# Patient Record
Sex: Male | Born: 1940 | Race: White | Hispanic: No | Marital: Married | Smoking: Former smoker
Health system: Southern US, Community
[De-identification: ages and names within clinical notes are randomized; demographics above are authoritative.]

## PROBLEM LIST (undated history)

## (undated) DIAGNOSIS — M549 Dorsalgia, unspecified: Secondary | ICD-10-CM

## (undated) DIAGNOSIS — M1990 Unspecified osteoarthritis, unspecified site: Secondary | ICD-10-CM

## (undated) DIAGNOSIS — Z9889 Other specified postprocedural states: Secondary | ICD-10-CM

## (undated) DIAGNOSIS — C9110 Chronic lymphocytic leukemia of B-cell type not having achieved remission: Secondary | ICD-10-CM

## (undated) DIAGNOSIS — I2510 Atherosclerotic heart disease of native coronary artery without angina pectoris: Secondary | ICD-10-CM

## (undated) DIAGNOSIS — E785 Hyperlipidemia, unspecified: Secondary | ICD-10-CM

## (undated) DIAGNOSIS — G8929 Other chronic pain: Secondary | ICD-10-CM

## (undated) DIAGNOSIS — R112 Nausea with vomiting, unspecified: Secondary | ICD-10-CM

## (undated) DIAGNOSIS — E119 Type 2 diabetes mellitus without complications: Secondary | ICD-10-CM

## (undated) DIAGNOSIS — D171 Benign lipomatous neoplasm of skin and subcutaneous tissue of trunk: Principal | ICD-10-CM

## (undated) DIAGNOSIS — K219 Gastro-esophageal reflux disease without esophagitis: Secondary | ICD-10-CM

## (undated) DIAGNOSIS — Z720 Tobacco use: Secondary | ICD-10-CM

## (undated) DIAGNOSIS — G4733 Obstructive sleep apnea (adult) (pediatric): Secondary | ICD-10-CM

## (undated) DIAGNOSIS — R001 Bradycardia, unspecified: Secondary | ICD-10-CM

## (undated) DIAGNOSIS — I739 Peripheral vascular disease, unspecified: Secondary | ICD-10-CM

## (undated) DIAGNOSIS — I10 Essential (primary) hypertension: Secondary | ICD-10-CM

## (undated) DIAGNOSIS — R351 Nocturia: Secondary | ICD-10-CM

## (undated) HISTORY — DX: Benign lipomatous neoplasm of skin and subcutaneous tissue of trunk: D17.1

## (undated) HISTORY — PX: CARDIAC CATHETERIZATION: SHX172

## (undated) HISTORY — DX: Essential (primary) hypertension: I10

## (undated) HISTORY — DX: Atherosclerotic heart disease of native coronary artery without angina pectoris: I25.10

## (undated) HISTORY — DX: Peripheral vascular disease, unspecified: I73.9

## (undated) HISTORY — DX: Obstructive sleep apnea (adult) (pediatric): G47.33

## (undated) HISTORY — DX: Bradycardia, unspecified: R00.1

## (undated) HISTORY — DX: Tobacco use: Z72.0

## (undated) HISTORY — DX: Hyperlipidemia, unspecified: E78.5

---

## 1992-05-17 DIAGNOSIS — R112 Nausea with vomiting, unspecified: Secondary | ICD-10-CM

## 1992-05-17 DIAGNOSIS — Z9889 Other specified postprocedural states: Secondary | ICD-10-CM

## 1992-05-17 HISTORY — DX: Nausea with vomiting, unspecified: R11.2

## 1992-05-17 HISTORY — DX: Other specified postprocedural states: Z98.890

## 1993-02-14 HISTORY — PX: CORONARY ARTERY BYPASS GRAFT: SHX141

## 1993-03-10 HISTORY — PX: CARDIAC CATHETERIZATION: SHX172

## 1998-04-15 ENCOUNTER — Ambulatory Visit (HOSPITAL_COMMUNITY): Admission: RE | Admit: 1998-04-15 | Discharge: 1998-04-15 | Payer: Self-pay | Admitting: Gastroenterology

## 1998-05-17 HISTORY — PX: CYSTECTOMY: SUR359

## 1999-03-12 ENCOUNTER — Emergency Department (HOSPITAL_COMMUNITY): Admission: EM | Admit: 1999-03-12 | Discharge: 1999-03-12 | Payer: Self-pay | Admitting: Emergency Medicine

## 2000-10-07 ENCOUNTER — Encounter: Payer: Self-pay | Admitting: Chiropractic Medicine

## 2000-10-07 ENCOUNTER — Encounter: Admission: RE | Admit: 2000-10-07 | Discharge: 2000-10-07 | Payer: Self-pay | Admitting: Chiropractic Medicine

## 2000-10-08 ENCOUNTER — Encounter: Admission: RE | Admit: 2000-10-08 | Discharge: 2000-10-08 | Payer: Self-pay | Admitting: Chiropractic Medicine

## 2000-10-08 ENCOUNTER — Encounter: Payer: Self-pay | Admitting: Chiropractic Medicine

## 2001-06-13 ENCOUNTER — Ambulatory Visit (HOSPITAL_COMMUNITY): Admission: RE | Admit: 2001-06-13 | Discharge: 2001-06-13 | Payer: Self-pay | Admitting: Gastroenterology

## 2003-03-07 ENCOUNTER — Encounter (INDEPENDENT_AMBULATORY_CARE_PROVIDER_SITE_OTHER): Payer: Self-pay | Admitting: Specialist

## 2003-03-07 ENCOUNTER — Ambulatory Visit (HOSPITAL_COMMUNITY): Admission: RE | Admit: 2003-03-07 | Discharge: 2003-03-07 | Payer: Self-pay | Admitting: General Surgery

## 2003-08-21 ENCOUNTER — Encounter: Admission: RE | Admit: 2003-08-21 | Discharge: 2003-08-21 | Payer: Self-pay | Admitting: Internal Medicine

## 2005-03-03 ENCOUNTER — Encounter: Admission: RE | Admit: 2005-03-03 | Discharge: 2005-03-03 | Payer: Self-pay | Admitting: Internal Medicine

## 2007-11-27 ENCOUNTER — Encounter: Admission: RE | Admit: 2007-11-27 | Discharge: 2007-11-27 | Payer: Self-pay | Admitting: Internal Medicine

## 2007-12-31 DIAGNOSIS — D171 Benign lipomatous neoplasm of skin and subcutaneous tissue of trunk: Secondary | ICD-10-CM

## 2007-12-31 HISTORY — DX: Benign lipomatous neoplasm of skin and subcutaneous tissue of trunk: D17.1

## 2008-06-26 HISTORY — PX: CARDIAC CATHETERIZATION: SHX172

## 2008-10-11 ENCOUNTER — Encounter: Admission: RE | Admit: 2008-10-11 | Discharge: 2008-10-11 | Payer: Self-pay | Admitting: Internal Medicine

## 2009-02-13 ENCOUNTER — Ambulatory Visit: Payer: Self-pay | Admitting: Urology

## 2010-06-07 ENCOUNTER — Encounter (HOSPITAL_BASED_OUTPATIENT_CLINIC_OR_DEPARTMENT_OTHER): Payer: Self-pay | Admitting: Internal Medicine

## 2010-07-24 HISTORY — PX: CARDIOVASCULAR STRESS TEST: SHX262

## 2011-03-04 ENCOUNTER — Other Ambulatory Visit: Payer: Self-pay | Admitting: Internal Medicine

## 2011-03-04 DIAGNOSIS — M542 Cervicalgia: Secondary | ICD-10-CM

## 2011-03-06 ENCOUNTER — Ambulatory Visit
Admission: RE | Admit: 2011-03-06 | Discharge: 2011-03-06 | Disposition: A | Discharge status: Home / Self Care | Payer: Self-pay | Source: Ambulatory Visit | Attending: Internal Medicine | Admitting: Internal Medicine

## 2011-03-06 DIAGNOSIS — M542 Cervicalgia: Secondary | ICD-10-CM

## 2011-03-19 ENCOUNTER — Encounter (INDEPENDENT_AMBULATORY_CARE_PROVIDER_SITE_OTHER): Payer: PRIVATE HEALTH INSURANCE | Admitting: Surgery

## 2011-03-31 ENCOUNTER — Encounter (INDEPENDENT_AMBULATORY_CARE_PROVIDER_SITE_OTHER): Payer: Self-pay | Admitting: General Surgery

## 2011-04-02 ENCOUNTER — Ambulatory Visit (INDEPENDENT_AMBULATORY_CARE_PROVIDER_SITE_OTHER): Payer: Medicare Other | Admitting: Surgery

## 2011-04-02 ENCOUNTER — Encounter (INDEPENDENT_AMBULATORY_CARE_PROVIDER_SITE_OTHER): Payer: Self-pay | Admitting: Surgery

## 2011-04-02 VITALS — BP 118/70 | HR 80 | Temp 97.1°F | Resp 18 | Ht 70.0 in | Wt 216.5 lb

## 2011-04-02 DIAGNOSIS — D1739 Benign lipomatous neoplasm of skin and subcutaneous tissue of other sites: Secondary | ICD-10-CM

## 2011-04-02 DIAGNOSIS — D171 Benign lipomatous neoplasm of skin and subcutaneous tissue of trunk: Secondary | ICD-10-CM

## 2011-04-02 NOTE — Patient Instructions (Signed)
We will schedule surgery to remove the small lump on your right chest wall. This will be as an outpatient and you can resume normal activities after the surgery

## 2011-04-02 NOTE — Progress Notes (Signed)
  CC: Mass of back HPI: This patient was referred by his primary physician for a small mass on the right lateral chest wall bacteria. It is almost totally asymptomatic but occasionally bother him a little bit. He saw my now retired partner, Cyd Silence, in August of 2009 for this. At that time they elected to nothing. It was thought to be a lipoma   ROS: Our 12 point ROS was filled out by the patient and his negative.  MEDS: Current Outpatient Prescriptions on File Prior to Visit  Medication Sig Dispense Refill  . simvastatin (ZOCOR) 40 MG tablet Take 40 mg by mouth at bedtime.           ALLERGIES: No Known Allergies     PE General: The patient is alert oriented and generally healthy appearing. Chest: There is a 1.5 cm discrete mass in about the right mid axillary line. It feels attached to the scan and is a little firmer than the usual lipoma. There is not a punctum.  Data Reviewed Reviewed on our old chart and  from his primary care physician  Assessment Subcutaneous mass right lateral chest lipoma versus cyst  Plan However the alternatives which are discontinued followup since that is the same size as when seen by Dr.Ballen. Alternatively this could be removed under local anesthesia. Like to have it removed I will try to arrange that for him.

## 2012-03-23 HISTORY — PX: OTHER SURGICAL HISTORY: SHX169

## 2012-05-04 ENCOUNTER — Other Ambulatory Visit (HOSPITAL_COMMUNITY): Payer: Self-pay | Admitting: Cardiovascular Disease

## 2012-05-04 DIAGNOSIS — I2510 Atherosclerotic heart disease of native coronary artery without angina pectoris: Secondary | ICD-10-CM

## 2012-07-26 ENCOUNTER — Encounter (HOSPITAL_COMMUNITY): Payer: Self-pay

## 2012-10-17 ENCOUNTER — Ambulatory Visit (INDEPENDENT_AMBULATORY_CARE_PROVIDER_SITE_OTHER): Payer: Medicare Other | Admitting: Cardiology

## 2012-10-17 ENCOUNTER — Encounter: Payer: Self-pay | Admitting: Cardiology

## 2012-10-17 VITALS — BP 126/64 | HR 59 | Ht 70.0 in | Wt 233.0 lb

## 2012-10-17 DIAGNOSIS — I10 Essential (primary) hypertension: Secondary | ICD-10-CM | POA: Insufficient documentation

## 2012-10-17 DIAGNOSIS — I739 Peripheral vascular disease, unspecified: Secondary | ICD-10-CM

## 2012-10-17 DIAGNOSIS — E785 Hyperlipidemia, unspecified: Secondary | ICD-10-CM | POA: Insufficient documentation

## 2012-10-17 DIAGNOSIS — I2510 Atherosclerotic heart disease of native coronary artery without angina pectoris: Secondary | ICD-10-CM

## 2012-10-17 DIAGNOSIS — Z720 Tobacco use: Secondary | ICD-10-CM

## 2012-10-17 DIAGNOSIS — E119 Type 2 diabetes mellitus without complications: Secondary | ICD-10-CM | POA: Insufficient documentation

## 2012-10-17 DIAGNOSIS — F17200 Nicotine dependence, unspecified, uncomplicated: Secondary | ICD-10-CM

## 2012-10-17 HISTORY — DX: Tobacco use: Z72.0

## 2012-10-17 NOTE — Assessment & Plan Note (Signed)
Controlled on today's visit

## 2012-10-17 NOTE — Assessment & Plan Note (Signed)
Last cath 2010, grafts are 71 years old.  As per Dr. Hazle Coca recommendations we would like to do a stress test but patient does not wish to undergo this test unless he has a problem.  Reminded at the age of the graft and that we would be glad to see him at any point to do the nuclear stress test. Additionally instructed to call if he does develop shortness of breath with exertion or chest pain.  He does have nitroglycerin to take he needs them.

## 2012-10-17 NOTE — Assessment & Plan Note (Signed)
He states he does not use very often, and does help with the stress of caring for his wife who has multiple sclerosis. Again discussed importance of not using tobacco

## 2012-10-17 NOTE — Assessment & Plan Note (Signed)
Followed by Dr. Jarold Motto

## 2012-10-17 NOTE — Progress Notes (Signed)
10/17/2012   PCP: Garlan Fillers, MD   Chief Complaint  Patient presents with  . rov 6 months    Le edema    Primary Cardiologist:Dr. Erlene Quan   HPI: 73 year old, mild to moderately overweight, married Caucasian male, father of 2, grandfather to 4 grandchildren whose wife unfortunately is wheelchair bound with multiple sclerosis which has been progressively worsening. The patient is the primary caregiver and therefore is under a lot of stress at home. He has a history of CAD status post coronary artery bypass grafting in 1994. His other problems include hypertension and hyperlipidemia. He denied chest pain or shortness of breath. Last cardiac cath was June 26, 2008, revealing a patent LIMA to his LAD, a patent vein to a diagonal branch and a patent sequential vein to OM1 and 2 with an 80% stenosis just beyond the distal insertion and a 70% mid nondominant RCA stenosis with normal LV function. 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 His last Myoview performed July 24, 2010, was nonischemic. Recent lab work performed by Dr. Eloise Harman revealed total cholesterol 154, LDL 93 and HDL 30, similar to his prior readings.    Today he presents for routine cardiology followup. He denies any significant chest pain has not needed nitroglycerin. He occasionally has mild lower extremity edema that is stable.  He has no complaints.  Allergies  Allergen Reactions  . Lipitor (Atorvastatin)   . Codeine Rash    Current Outpatient Prescriptions  Medication Sig Dispense Refill  . aspirin 325 MG tablet Take 325 mg by mouth daily.      . cetirizine (ZYRTEC) 10 MG tablet Take 10 mg by mouth daily.      . Cinnamon 500 MG capsule Take 500 mg by mouth. 4 capsules daily      . Coenzyme Q10 (COQ-10) 400 MG CAPS Take 1 capsule by mouth  daily.      Marland Kitchen DIOVAN 160 MG tablet Take 160 mg by mouth daily.      Marland Kitchen doxazosin (CARDURA) 2 MG tablet Take 2 mg by mouth at bedtime.       . fluticasone (FLONASE) 50 MCG/ACT nasal spray as needed.      Marland Kitchen HYDROcodone-acetaminophen (VICODIN) 5-500 MG per tablet       . metFORMIN (GLUCOPHAGE-XR) 500 MG 24 hr tablet Take 500 mg by mouth 2 (two) times daily.      . metoprolol succinate (TOPROL-XL) 25 MG 24 hr tablet Take 25 mg by mouth daily.      . Multiple Vitamin (MULTIVITAMIN) capsule Take 1 capsule by mouth daily.      Marland Kitchen NITROSTAT 0.4 MG SL tablet every 5 (five) minutes as needed.       . simvastatin (ZOCOR) 40 MG tablet Take 40 mg by mouth at bedtime.         No current facility-administered medications for this visit.    Past Medical History  Diagnosis Date  . Diabetes mellitus   . Hyperlipidemia   . 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 CAD (coronary artery disease)     Hx of CABG 1994, last cath 2010 with patent grafts but graft insertion stenosis, treated medically  . PAD (peripheral artery disease)     75% lt renal artery stenosis, followed by dopplersand mild carotid stenosis, mild bilaateral  .  HTN (hypertension)   . Tobacco chew use 10/17/2012    Past Surgical History  Procedure Laterality Date  . Coronary artery bypass graft  1994  . Cystectomy  2000    left rib cage area   . Cardiac catheterization  1995, 2010    LIMA PATENT,  VG-diag; seq VG-OM1 & 2patent though 80% stenosis just beyond the insertion site   LAST NUC 06/2010 NEGATIVE FOR ISCHEMIA    ZOX:WRUEAVW:UJ colds or fevers, no weight changes Skin:no rashes or ulcers HEENT:no blurred vision, no congestion CV:see HPI PUL:see HPI GI:no diarrhea constipation or melena, no indigestion GU:no hematuria, no dysuria MS:no joint pain, no claudication Neuro:no syncope, no lightheadedness Endo:no diabetes, no thyroid disease  PHYSICAL EXAM BP 126/64  Pulse 59  Ht 5\' 10"  (1.778 m)  Wt 233 lb (105.688 kg)  BMI 33.43  kg/m2 General:Pleasant affect, NAD Skin:Warm and dry, brisk capillary refill HEENT:normocephalic, sclera clear, mucus membranes moist Neck:supple, no JVD, no bruits  Heart:S1S2 RRR without murmur, gallup, rub or click Lungs:clear without rales, rhonchi, or wheezes WJX:BJYN, non tender, + BS, do not palpate liver spleen or masses Ext:no lower ext edema, 2+ pedal pulses, 2+ radial pulses Neuro:alert and oriented, MAE, follows commands, + facial symmetry  WGN:FAOZHY at 59, 1st degree AV Block with PR 230 ms.Similar to last tracing.  ASSESSMENT AND PLAN CAD (coronary artery disease) Last cath 2010, grafts are 71 years old.  As per Dr. Hazle Coca recommendations we would like to do a stress test but patient does not wish to undergo this test unless he has a problem.  Reminded at the age of the graft and that we would be glad to see him at any point to do the nuclear stress test. Additionally instructed to call if he does develop shortness of breath with exertion or chest pain.  He does have nitroglycerin to take he needs them.  HTN (hypertension) Controlled on today's visit  Hyperlipidemia Dr. Jarold Motto follows his cholesterol and in December his LDL was 93 HDL 30.  Diabetes mellitus Followed by Dr. Jarold Motto  Tobacco chew use He states he does not use very often, and does help with the stress of caring for his wife who has multiple sclerosis. Again discussed importance of not using tobacco

## 2012-10-17 NOTE — Assessment & Plan Note (Signed)
Dr. Jarold Motto follows his cholesterol and in December his LDL was 93 HDL 30.

## 2012-10-17 NOTE — Patient Instructions (Addendum)
Call if any chest pain, shortness of breath or increased swelling. Dr. Allyson Sabal would like you to have stress test-your grafts are 71 years old, so call when you are ready to have. Heart Healthy diabetic diet. Otherwise follow up with Dr berry in 6 months.

## 2012-10-18 ENCOUNTER — Encounter: Payer: Self-pay | Admitting: Cardiology

## 2012-11-23 ENCOUNTER — Other Ambulatory Visit (HOSPITAL_COMMUNITY): Payer: Self-pay | Admitting: Cardiovascular Disease

## 2012-11-23 NOTE — Telephone Encounter (Signed)
Rx was sent to pharmacy electronically. 

## 2012-12-17 ENCOUNTER — Other Ambulatory Visit (HOSPITAL_COMMUNITY): Payer: Self-pay | Admitting: Cardiovascular Disease

## 2012-12-18 NOTE — Telephone Encounter (Signed)
Rx was sent to pharmacy electronically. 

## 2013-03-29 ENCOUNTER — Telehealth: Payer: Self-pay | Admitting: Oncology

## 2013-03-29 NOTE — Telephone Encounter (Signed)
S/W PT AND GVE NP APPT 11/26 @ 10:30 W/DR. SHADAD REFERRING DR. DAN PATTERSON DX- LEUKOCYTOSIS WELCOME PACKET MAILED

## 2013-03-29 NOTE — Telephone Encounter (Signed)
C/D 03/29/13 for appt. 04/11/13

## 2013-04-04 ENCOUNTER — Encounter: Payer: Self-pay | Admitting: Cardiovascular Disease

## 2013-04-04 ENCOUNTER — Ambulatory Visit (INDEPENDENT_AMBULATORY_CARE_PROVIDER_SITE_OTHER): Payer: Medicare Other | Admitting: Cardiovascular Disease

## 2013-04-04 VITALS — BP 156/92 | HR 63 | Ht 69.5 in | Wt 228.0 lb

## 2013-04-04 DIAGNOSIS — I10 Essential (primary) hypertension: Secondary | ICD-10-CM

## 2013-04-04 DIAGNOSIS — E785 Hyperlipidemia, unspecified: Secondary | ICD-10-CM

## 2013-04-04 DIAGNOSIS — Z79899 Other long term (current) drug therapy: Secondary | ICD-10-CM

## 2013-04-04 DIAGNOSIS — I2510 Atherosclerotic heart disease of native coronary artery without angina pectoris: Secondary | ICD-10-CM

## 2013-04-04 DIAGNOSIS — I739 Peripheral vascular disease, unspecified: Secondary | ICD-10-CM

## 2013-04-04 NOTE — Assessment & Plan Note (Signed)
On statin therapy with recent lipid profile performed by his PCP revealing a total cholesterol 193, LDL of 103 and HDL of 32. He does admit to dietary indiscretion.

## 2013-04-04 NOTE — Assessment & Plan Note (Signed)
And he was seen in the office 6 months ago he has remained completely stable. His left cardiac catheterization performed by myself 06/26/08 revealed patent grafts including a LIMA to his LAD, vein to the diagonal branch and a sequential vein to OM 1 and 2. He did have a total LAD and circumflex coronary artery and normal LV function with 75% left renal artery stenosis which was Ethibond a duplex ultrasound. A stress Myoview performed assessment/was nonischemic. I'm going to obtain an exercise Myoview stress test on him after the first year which will be from his prior study in a diabetic status post bypass grafting.

## 2013-04-04 NOTE — Assessment & Plan Note (Signed)
Borderline control today on appropriate medications

## 2013-04-04 NOTE — Patient Instructions (Signed)
  Your physician wants you to follow-up with him in : 1 year with Dr Allyson Sabal                                            and with an extender in : 6 months with Nada Boozer NP                    You will receive a reminder letter in the mail one month in advance. If you don't receive a letter, please call our office to schedule the follow-up appointment.   Your physician recommends that you return for lab work at your convenience, fasting    Your physician has ordered the following tests: exercise myoview and carotid dopplers

## 2013-04-04 NOTE — Progress Notes (Signed)
04/04/2013 Austin Herman   Sep 19, 1940  [id number]  Primary Physician Austin Fillers, MD Primary Cardiologist: Austin Gess MD Austin Herman   HPI:  The patient is a very pleasant 72 year old, mild to moderately overweight, married Caucasian male, father of 2, grandfather to 4 grandchildren whose wife unfortunately is wheelchair bound with multiple sclerosis which has been progressively worsening. The patient is the primary caregiver and therefore is under a lot of stress at home. He has a history of CAD status post coronary artery bypass grafting in 1994. His other problems include hypertension and hyperlipidemia. He denied chest pain or shortness of breath. I cath'd him June 26, 2008, revealing a patent LIMA to his LAD, a patent vein to a diagonal branch and a patent sequential vein to OM1 and 2 with an 80% stenosis just beyond the distal insertion and a 70% mid nondominant RCA stenosis with normal LV function. 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 His last Myoview performed July 24, 2010, was nonischemic. Recent lab work performed by Dr. Eloise Herman revealed total cholesterol 193, LDL of 103 and HDL of 32., moderately increased compared to his prior readings. Note no change in his symptoms and denies chest pain or shortness of breath.     Current Outpatient Prescriptions  Medication Sig Dispense Refill  . aspirin 325 MG tablet Take 325 mg by mouth daily.      . cetirizine (ZYRTEC) 10 MG tablet Take 10 mg by mouth daily.      Marland Kitchen DIOVAN 160 MG tablet TAKE 1 TABLET BY MOUTH EVERY DAY  30 tablet  11  . doxazosin (CARDURA) 2 MG tablet Take 2 mg by mouth at bedtime.       Marland Kitchen FARXIGA 10 MG TABS Take 10 mg by mouth daily.      . fluticasone (FLONASE) 50 MCG/ACT nasal spray Place 1 spray into the nose as needed.         Marland Kitchen HYDROcodone-acetaminophen (NORCO/VICODIN) 5-325 MG per tablet Take 1 tablet by mouth as needed for moderate pain.      . metFORMIN (GLUCOPHAGE-XR) 500 MG 24 hr tablet Take 500 mg by mouth 2 (two) times daily.      . metoprolol succinate (TOPROL-XL) 25 MG 24 hr tablet TAKE 1 TABLET BY MOUTH EVERY DAY  30 tablet  10  . Multiple Vitamin (MULTIVITAMIN) capsule Take 1 capsule by mouth daily.      Marland Kitchen NITROSTAT 0.4 MG SL tablet every 5 (five) minutes as needed.       . Omega-3 Fatty Acids (FISH OIL) 1200 MG CAPS Take 1,200 mg by mouth daily.      . simvastatin (ZOCOR) 40 MG tablet Take 40 mg by mouth at bedtime.         No current facility-administered medications for this visit.    Allergies  Allergen Reactions  . Lipitor [Atorvastatin]   . Codeine Rash    History   Social History  . Marital Status: Married    Spouse Name: N/A    Number of Children: N/A  . Years of Education: N/A   Occupational History  . Not on file.   Social History Main Topics  . Smoking status: Former Smoker    Types: Cigars  . Smokeless tobacco: Current User    Types: Chew  Comment: not ready to quit, does not use very often.  . Alcohol Use: Yes  . Drug Use: No  . Sexual Activity: Not on file   Other Topics Concern  . Not on file   Social History Narrative  . No narrative on file     Review of Systems: General: negative for chills, fever, night sweats or weight changes.  Cardiovascular: negative for chest pain, dyspnea on exertion, edema, orthopnea, palpitations, paroxysmal nocturnal dyspnea or shortness of breath Dermatological: negative for rash Respiratory: negative for cough or wheezing Urologic: negative for hematuria Abdominal: negative for nausea, vomiting, diarrhea, bright red blood per rectum, melena, or hematemesis Neurologic: negative for visual changes, syncope, or dizziness All other systems reviewed and are otherwise negative except as noted above.    Blood pressure 156/92,  pulse 63, height 5' 9.5" (1.765 m), weight 228 lb (103.42 kg).  General appearance: alert and no distress Neck: no adenopathy, no carotid bruit, no JVD, supple, symmetrical, trachea midline and thyroid not enlarged, symmetric, no tenderness/mass/nodules Lungs: clear to auscultation bilaterally Heart: regular rate and rhythm, S1, S2 normal, no murmur, click, rub or gallop Extremities: extremities normal, atraumatic, no cyanosis or edema  EKG sinus rhythm at 63 without ST or T wave changes  ASSESSMENT AND PLAN:   CAD (coronary artery disease) And he was seen in the office 6 months ago he has remained completely stable. His left cardiac catheterization performed by myself 06/26/08 revealed patent grafts including a LIMA to his LAD, vein to the diagonal branch and a sequential vein to OM 1 and 2. He did have a total LAD and circumflex coronary artery and normal LV function with 75% left renal artery stenosis which was Ethibond a duplex ultrasound. A stress Myoview performed assessment/was nonischemic. I'm going to obtain an exercise Myoview stress test on him after the first year which will be from his prior study in a diabetic status post bypass grafting.  PAD (peripheral artery disease) Patient has moderate renal artery stenosis as well as mild to moderate carotid artery disease by duplex ultrasound. His last carotid Doppler performed 03/23/12 revealed moderate bilateral ICA stenosis. He was neurologically asymptomatic. We will repeat this  HTN (hypertension) Borderline control today on appropriate medications  Hyperlipidemia On statin therapy with recent lipid profile performed by his PCP revealing a total cholesterol 193, LDL of 103 and HDL of 32. He does admit to dietary indiscretion.      Austin Gess MD FACP,FACC,FAHA, [facility] 04/04/2013 10:07 AM

## 2013-04-04 NOTE — Assessment & Plan Note (Signed)
Patient has moderate renal artery stenosis as well as mild to moderate carotid artery disease by duplex ultrasound. His last carotid Doppler performed 03/23/12 revealed moderate bilateral ICA stenosis. He was neurologically asymptomatic. We will repeat this

## 2013-04-06 ENCOUNTER — Other Ambulatory Visit: Payer: Self-pay | Admitting: Oncology

## 2013-04-06 DIAGNOSIS — D72829 Elevated white blood cell count, unspecified: Secondary | ICD-10-CM

## 2013-04-09 ENCOUNTER — Encounter: Payer: Self-pay | Admitting: Cardiovascular Disease

## 2013-04-11 ENCOUNTER — Ambulatory Visit (HOSPITAL_BASED_OUTPATIENT_CLINIC_OR_DEPARTMENT_OTHER): Payer: 59 | Admitting: Oncology

## 2013-04-11 ENCOUNTER — Ambulatory Visit (HOSPITAL_BASED_OUTPATIENT_CLINIC_OR_DEPARTMENT_OTHER): Payer: Medicare Other

## 2013-04-11 ENCOUNTER — Encounter: Payer: Self-pay | Admitting: Oncology

## 2013-04-11 ENCOUNTER — Other Ambulatory Visit (HOSPITAL_COMMUNITY)
Admission: RE | Admit: 2013-04-11 | Discharge: 2013-04-11 | Disposition: A | Discharge status: Home / Self Care | Payer: 59 | Source: Ambulatory Visit | Attending: Oncology | Admitting: Oncology

## 2013-04-11 ENCOUNTER — Telehealth: Payer: Self-pay | Admitting: Oncology

## 2013-04-11 ENCOUNTER — Other Ambulatory Visit (HOSPITAL_BASED_OUTPATIENT_CLINIC_OR_DEPARTMENT_OTHER): Payer: 59 | Admitting: Lab

## 2013-04-11 VITALS — BP 114/50 | HR 47 | Temp 97.6°F | Resp 18 | Ht 69.5 in | Wt 227.2 lb

## 2013-04-11 DIAGNOSIS — D72820 Lymphocytosis (symptomatic): Secondary | ICD-10-CM

## 2013-04-11 DIAGNOSIS — D72829 Elevated white blood cell count, unspecified: Secondary | ICD-10-CM

## 2013-04-11 LAB — COMPREHENSIVE METABOLIC PANEL (CC13)
AST: 23 U/L (ref 5–34)
Alkaline Phosphatase: 87 U/L (ref 40–150)
Chloride: 106 mEq/L (ref 98–109)
Glucose: 181 mg/dl — ABNORMAL HIGH (ref 70–140)
Total Bilirubin: 0.61 mg/dL (ref 0.20–1.20)
Total Protein: 7.5 g/dL (ref 6.4–8.3)

## 2013-04-11 LAB — CBC WITH DIFFERENTIAL/PLATELET
BASO%: 0.6 % (ref 0.0–2.0)
EOS%: 1.7 % (ref 0.0–7.0)
HCT: 45.3 % (ref 38.4–49.9)
HGB: 14.6 g/dL (ref 13.0–17.1)
MCH: 28.9 pg (ref 27.2–33.4)
MCHC: 32.3 g/dL (ref 32.0–36.0)
MCV: 89.5 fL (ref 79.3–98.0)
MONO#: 0.7 10*3/uL (ref 0.1–0.9)
NEUT#: 3.6 10*3/uL (ref 1.5–6.5)
NEUT%: 21.8 % — ABNORMAL LOW (ref 39.0–75.0)
RBC: 5.06 10*6/uL (ref 4.20–5.82)
lymph#: 11.8 10*3/uL — ABNORMAL HIGH (ref 0.9–3.3)

## 2013-04-11 NOTE — Consult Note (Signed)
Reason for Referral: Lymphocytosis.   HPI: 72 year old gentleman native of [place] but have been living in this area the majority of his life. He has a past medical history significant for coronary artery disease well as recent diagnosis of diabetes. He has been in his usual state of health without really any symptomatology when he was noted to have a lymphocytosis on his routine CBC by his primary care physician. His white cell count was elevated on 15,000 with a increased lymphocyte percentage of around 58%. He had a normal white cell count and normal platelet count. This was done in November of 2014 and was compared to 2013 where he had mild lymphocytosis with a white cell count of 11,000 at slightly increased lymphocyte percentage. He has no previous counts available at this time. Patient reports he has not had any fevers or chills or sweats. Has not reported any weight loss or appetite changes. Has not reported any lymphadenopathy.  Clinically, he is completely asymptomatic. He did not report any headaches, blurred vision, double vision or changes in his mental status. He does not report any headaches or seizure activity. He does not report any chest pain shortness of breath or difficulty breathing. Does not report any cough or hemoptysis. Does not report any nausea or vomiting or abdominal pain. Does not report any constipation or diarrhea. Does not report any melena or hematochezia. Does not report any frequency urgency or hesitancy. Does not report any hematuria or dysuria. Is that report any bleeding or clotting tendencies. Is that report any recurrent sinopulmonary infections. Has not reported any recent hospitalization or illnesses. He denied any recent viral infections. He is continued to be very active and cares for his wife that is medically disabled due to multiple sclerosis.  Past Medical History  Diagnosis Date  . Diabetes mellitus   . Hyperlipidemia   . 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  CAD (coronary artery disease)     Hx of CABG 1994, last cath 2010 with patent grafts but graft insertion stenosis, treated medically  . PAD (peripheral artery disease)     75% lt renal artery stenosis, followed by dopplersand mild carotid stenosis, mild bilaateral  . HTN (hypertension)   . Tobacco chew use 10/17/2012  . OSA (obstructive sleep apnea)     Sleep Study - 03/30/2009 - AHI-40.6/hr, AHI during REM-36.9/hr, RDI-52.4, RDI during REM-36.9/hr, and average O2 sat during REM and NREM-90%.  :  Past Surgical History  Procedure Laterality Date  . Coronary artery bypass graft  1994  . Cystectomy  2000    left rib cage area   . Cardiac catheterization  1995, 2010    LIMA PATENT,  VG-diag; seq VG-OM1 & 2patent though 80% stenosis just beyond the insertion site   LAST NUC 06/2010 NEGATIVE FOR ISCHEMIA  . Carotid doppler  03/23/2012    R Subclavian 0-49% diameter reduction, Bilat Bulb/Proximal ICAs-no significant diameter reduction, Bilat mid ICAs 50-69% diameter reduction, L Subclavian 50-69% diameter reduction.  . Cardiac catheterization  03/10/1993    Recommendation - CABG  . Coronary artery bypass graft  02/1993    LIMA to LAD, vein graft to firs diag, sequential vein graft to OM1 and OM 2  . Cardiac catheterization  06/26/2008    No intervention. Continue medical therapy.  . Cardiovascular stress test  07/24/2010    No scintigraphic evidence for inducable myocardial ischemia.ECG positive for ischemia. Exaggerated blood pressure response to exercise.   :  Current Outpatient Prescriptions  Medication Sig  Dispense Refill  . aspirin 325 MG tablet Take 325 mg by mouth daily.      . cetirizine (ZYRTEC) 10 MG tablet Take 10 mg by mouth daily.      Marland Kitchen DIOVAN 160 MG tablet TAKE 1 TABLET BY MOUTH EVERY DAY  30 tablet  11  . doxazosin (CARDURA) 2 MG tablet Take 2 mg by mouth at bedtime.       Marland Kitchen FARXIGA 10 MG TABS Take 10 mg by mouth daily.      . fluticasone (FLONASE) 50 MCG/ACT nasal spray Place 1  spray into the nose as needed.       Marland Kitchen HYDROcodone-acetaminophen (NORCO/VICODIN) 5-325 MG per tablet Take 1 tablet by mouth as needed for moderate pain.      . metFORMIN (GLUCOPHAGE-XR) 500 MG 24 hr tablet Take 500 mg by mouth 2 (two) times daily.      . metoprolol succinate (TOPROL-XL) 25 MG 24 hr tablet TAKE 1 TABLET BY MOUTH EVERY DAY  30 tablet  10  . Multiple Vitamin (MULTIVITAMIN) capsule Take 1 capsule by mouth daily.      Marland Kitchen NITROSTAT 0.4 MG SL tablet every 5 (five) minutes as needed.       . Omega-3 Fatty Acids (FISH OIL) 1200 MG CAPS Take 1,200 mg by mouth daily.      . simvastatin (ZOCOR) 40 MG tablet Take 40 mg by mouth at bedtime.         No current facility-administered medications for this visit.       Allergies  Allergen Reactions  . Lipitor [Atorvastatin]   . Codeine Rash  :  Family History  Problem Relation Age of Onset  . Cancer Father   . Cancer Brother   :  History   Social History  . Marital Status: Married    Spouse Name: N/A    Number of Children: N/A  . Years of Education: N/A   Occupational History  . Not on file.   Social History Main Topics  . Smoking status: Former Smoker    Types: Cigars  . Smokeless tobacco: Current User    Types: Chew     Comment: not ready to quit, does not use very often.  . Alcohol Use: Yes  . Drug Use: No  . Sexual Activity: Not on file   Other Topics Concern  . Not on file   Social History Narrative  . No narrative on file  :  Pertinent items are noted in HPI.  Exam: Blood pressure 114/50, pulse 47, temperature 97.6 F (36.4 C), temperature source Oral, resp. rate 18, height 5' 9.5" (1.765 m), weight 227 lb 3.2 oz (103.057 kg). General appearance: alert, cooperative and appears stated age Head: Normocephalic, without obvious abnormality, atraumatic Nose: Nares normal. Septum midline. Mucosa normal. No drainage or sinus tenderness. Throat: lips, mucosa, and tongue normal; teeth and gums normal Neck: no  adenopathy, no carotid bruit, no JVD, supple, symmetrical, trachea midline and thyroid not enlarged, symmetric, no tenderness/mass/nodules Back: symmetric, no curvature. ROM normal. No CVA tenderness. Resp: clear to auscultation bilaterally Chest wall: no tenderness Cardio: regular rate and rhythm, S1, S2 normal, no murmur, click, rub or gallop GI: soft, non-tender; bowel sounds normal; no masses,  no organomegaly Extremities: extremities normal, atraumatic, no cyanosis or edema Pulses: 2+ and symmetric Skin: Skin color, texture, turgor normal. No rashes or lesions Lymph nodes: Cervical, supraclavicular, and axillary nodes normal.   Recent Labs  04/11/13 1020  WBC 16.5*  HGB  14.6  HCT 45.3  PLT 139*      Blood smear review:   I personally reviewed his peripheral senior and showed increased lymphocytes with smudge cells noted. His red cells and platelet counts and morphology appeared normal   Assessment and Plan:   72 year old gentleman with a lymphocytosis associated with mild leukocytosis. This was discovered incidentally on his CBC with his blood count today confirming that. His white cell count around 16.5 K with his lymphocyte percentage around 71%. The differential diagnosis discussed today with Mr. Angelina Ok which include reactive lymphocytosis due to recent illnesses or exposure which is I think is less likely. 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 Although other lymphoproliferative disorders could be consideration such as prolymphocytic leukemia, leukemic phase of mantle cell lymphoma, among other conditions. To work this up completely, I will send his blood for flow cytometry I will also obtain a CT scan of the chest abdomen and pelvis. Depending on these findings the management approach will dictate that.  Given the fact that CLL is the most likely condition and the patient inquired about it  previously, I had a lengthy discussion discussing the natural course of this disease as well as the different treatment options. If it's in deed that's what he has, he is completely asymptomatic and probably will not require any treatment at this time. I counseled him about complications associated with lymphoproliferative disorder and more specifically CLL. These would include immune dysregulation, opportunistic infections, constitutional symptoms, and pancytopenia.  The plan for the time being is to obtain imaging studies and followup on the results of his flow cytometry and his followup will be dictated by these results.

## 2013-04-11 NOTE — Telephone Encounter (Signed)
gv and printed appt sched and avs for pt for Feb...gv pt barium   °

## 2013-04-11 NOTE — Progress Notes (Signed)
Please see consult note.  

## 2013-04-11 NOTE — Progress Notes (Signed)
Checked in new pt with no financial concerns. °

## 2013-04-18 ENCOUNTER — Ambulatory Visit (HOSPITAL_COMMUNITY)
Admission: RE | Admit: 2013-04-18 | Discharge: 2013-04-18 | Disposition: A | Discharge status: Home / Self Care | Payer: Medicare Other | Source: Ambulatory Visit | Attending: Cardiovascular Disease | Admitting: Cardiovascular Disease

## 2013-04-18 DIAGNOSIS — N281 Cyst of kidney, acquired: Secondary | ICD-10-CM | POA: Insufficient documentation

## 2013-04-18 DIAGNOSIS — I6529 Occlusion and stenosis of unspecified carotid artery: Secondary | ICD-10-CM | POA: Insufficient documentation

## 2013-04-18 DIAGNOSIS — R911 Solitary pulmonary nodule: Secondary | ICD-10-CM | POA: Insufficient documentation

## 2013-04-18 DIAGNOSIS — I739 Peripheral vascular disease, unspecified: Secondary | ICD-10-CM

## 2013-04-18 DIAGNOSIS — I700 Atherosclerosis of aorta: Secondary | ICD-10-CM | POA: Insufficient documentation

## 2013-04-18 DIAGNOSIS — I2510 Atherosclerotic heart disease of native coronary artery without angina pectoris: Secondary | ICD-10-CM | POA: Insufficient documentation

## 2013-04-18 DIAGNOSIS — R5381 Other malaise: Secondary | ICD-10-CM | POA: Insufficient documentation

## 2013-04-18 DIAGNOSIS — D72829 Elevated white blood cell count, unspecified: Secondary | ICD-10-CM | POA: Insufficient documentation

## 2013-04-18 DIAGNOSIS — I7090 Unspecified atherosclerosis: Secondary | ICD-10-CM | POA: Insufficient documentation

## 2013-04-18 DIAGNOSIS — N269 Renal sclerosis, unspecified: Secondary | ICD-10-CM | POA: Insufficient documentation

## 2013-04-18 DIAGNOSIS — R161 Splenomegaly, not elsewhere classified: Secondary | ICD-10-CM | POA: Insufficient documentation

## 2013-04-18 DIAGNOSIS — Z951 Presence of aortocoronary bypass graft: Secondary | ICD-10-CM | POA: Insufficient documentation

## 2013-04-18 NOTE — Progress Notes (Signed)
Carotid Duplex Completed. °Brianna L Mazza,RVT °

## 2013-04-19 ENCOUNTER — Encounter (HOSPITAL_COMMUNITY): Payer: Self-pay

## 2013-04-19 ENCOUNTER — Ambulatory Visit (HOSPITAL_COMMUNITY)
Admission: RE | Admit: 2013-04-19 | Discharge: 2013-04-19 | Disposition: A | Discharge status: Home / Self Care | Payer: Medicare Other | Source: Ambulatory Visit | Attending: Oncology | Admitting: Oncology

## 2013-04-19 DIAGNOSIS — R161 Splenomegaly, not elsewhere classified: Secondary | ICD-10-CM | POA: Insufficient documentation

## 2013-04-19 DIAGNOSIS — R5381 Other malaise: Secondary | ICD-10-CM | POA: Insufficient documentation

## 2013-04-19 DIAGNOSIS — D72829 Elevated white blood cell count, unspecified: Secondary | ICD-10-CM | POA: Insufficient documentation

## 2013-04-19 DIAGNOSIS — I700 Atherosclerosis of aorta: Secondary | ICD-10-CM | POA: Insufficient documentation

## 2013-04-19 DIAGNOSIS — N269 Renal sclerosis, unspecified: Secondary | ICD-10-CM | POA: Insufficient documentation

## 2013-04-19 DIAGNOSIS — R911 Solitary pulmonary nodule: Secondary | ICD-10-CM | POA: Insufficient documentation

## 2013-04-19 DIAGNOSIS — I2510 Atherosclerotic heart disease of native coronary artery without angina pectoris: Secondary | ICD-10-CM | POA: Insufficient documentation

## 2013-04-19 DIAGNOSIS — Z951 Presence of aortocoronary bypass graft: Secondary | ICD-10-CM | POA: Insufficient documentation

## 2013-04-19 DIAGNOSIS — N289 Disorder of kidney and ureter, unspecified: Secondary | ICD-10-CM | POA: Insufficient documentation

## 2013-04-19 DIAGNOSIS — K7689 Other specified diseases of liver: Secondary | ICD-10-CM | POA: Insufficient documentation

## 2013-04-19 DIAGNOSIS — D72820 Lymphocytosis (symptomatic): Secondary | ICD-10-CM

## 2013-04-19 DIAGNOSIS — N281 Cyst of kidney, acquired: Secondary | ICD-10-CM | POA: Insufficient documentation

## 2013-04-19 MED ORDER — IOHEXOL 300 MG/ML  SOLN
100.0000 mL | Freq: Once | INTRAMUSCULAR | Status: AC | PRN
  Administered 2013-04-19: 100 mL via INTRAVENOUS

## 2013-04-20 ENCOUNTER — Telehealth: Payer: Self-pay | Admitting: *Deleted

## 2013-04-20 NOTE — Telephone Encounter (Signed)
Message copied by Reesa Chew on Fri Apr 20, 2013 11:20 AM ------      Message from: Benjiman Core      Created: Thu Apr 19, 2013 12:04 PM       Please call him and let him know that his scan looks good and just keep his MD follow up in 06/2013. ------

## 2013-04-20 NOTE — Telephone Encounter (Signed)
Spoke with patient, let him know his scan looked good and to keep regularly scheduled appt for feb 2015

## 2013-04-23 ENCOUNTER — Telehealth: Payer: Self-pay | Admitting: *Deleted

## 2013-04-23 ENCOUNTER — Encounter: Payer: Self-pay | Admitting: *Deleted

## 2013-04-23 DIAGNOSIS — I6529 Occlusion and stenosis of unspecified carotid artery: Secondary | ICD-10-CM

## 2013-04-23 NOTE — Telephone Encounter (Signed)
Message copied by Marella Bile on Mon Apr 23, 2013  1:11 PM ------      Message from: Runell Gess      Created: Sun Apr 22, 2013  7:04 PM       No change from prior study. Repeat in 12 months. ------

## 2013-04-23 NOTE — Telephone Encounter (Signed)
Order placed for repeat carotid dopplers in 1 year  

## 2013-04-24 ENCOUNTER — Encounter (HOSPITAL_COMMUNITY): Payer: Self-pay | Admitting: Pharmacy Technician

## 2013-04-24 ENCOUNTER — Ambulatory Visit (INDEPENDENT_AMBULATORY_CARE_PROVIDER_SITE_OTHER): Payer: Medicare Other | Admitting: Physician Assistant

## 2013-04-24 ENCOUNTER — Encounter: Payer: Self-pay | Admitting: Cardiology

## 2013-04-24 ENCOUNTER — Other Ambulatory Visit: Payer: Self-pay | Admitting: *Deleted

## 2013-04-24 ENCOUNTER — Telehealth: Payer: Self-pay | Admitting: Cardiovascular Disease

## 2013-04-24 VITALS — BP 140/70 | HR 71 | Ht 70.0 in | Wt 227.0 lb

## 2013-04-24 DIAGNOSIS — F17200 Nicotine dependence, unspecified, uncomplicated: Secondary | ICD-10-CM

## 2013-04-24 DIAGNOSIS — E119 Type 2 diabetes mellitus without complications: Secondary | ICD-10-CM

## 2013-04-24 DIAGNOSIS — I447 Left bundle-branch block, unspecified: Secondary | ICD-10-CM

## 2013-04-24 DIAGNOSIS — I2510 Atherosclerotic heart disease of native coronary artery without angina pectoris: Secondary | ICD-10-CM

## 2013-04-24 DIAGNOSIS — I739 Peripheral vascular disease, unspecified: Secondary | ICD-10-CM

## 2013-04-24 DIAGNOSIS — Z01812 Encounter for preprocedural laboratory examination: Secondary | ICD-10-CM

## 2013-04-24 DIAGNOSIS — I200 Unstable angina: Secondary | ICD-10-CM

## 2013-04-24 DIAGNOSIS — E785 Hyperlipidemia, unspecified: Secondary | ICD-10-CM

## 2013-04-24 DIAGNOSIS — Z720 Tobacco use: Secondary | ICD-10-CM

## 2013-04-24 DIAGNOSIS — I10 Essential (primary) hypertension: Secondary | ICD-10-CM

## 2013-04-24 LAB — COMPLETE METABOLIC PANEL WITH GFR
AST: 19 U/L (ref 0–37)
Alkaline Phosphatase: 85 U/L (ref 39–117)
CO2: 28 mEq/L (ref 19–32)
Chloride: 101 mEq/L (ref 96–112)
Creat: 1.15 mg/dL (ref 0.50–1.35)
GFR, Est African American: 74 mL/min
GFR, Est Non African American: 64 mL/min
Glucose, Bld: 142 mg/dL — ABNORMAL HIGH (ref 70–99)
Potassium: 5.2 mEq/L (ref 3.5–5.3)
Sodium: 141 mEq/L (ref 135–145)
Total Bilirubin: 0.8 mg/dL (ref 0.3–1.2)
Total Protein: 7.3 g/dL (ref 6.0–8.3)

## 2013-04-24 NOTE — Patient Instructions (Addendum)
1.  We will schedule you for a left heart cath for tomorrow.  2.  Do not take Diovan and Metformin.

## 2013-04-24 NOTE — Telephone Encounter (Signed)
Lmom.  He is not scheduled for an angioplasty with [place].

## 2013-04-24 NOTE — Telephone Encounter (Signed)
Please have Samara Deist call him. This is in regards to his angioplasty he is going to have tomorrow,

## 2013-04-24 NOTE — Assessment & Plan Note (Addendum)
New LBBB.  Pre procedure labs,  Left heart cath tomorrow with Dr. Herbie Baltimore.  Continue Imdur. I told the patient he can increase the imdur to 60mg  if he has more pain.

## 2013-04-24 NOTE — Telephone Encounter (Signed)
Sent to [place] to advise

## 2013-04-24 NOTE — Telephone Encounter (Signed)
I spoke with patient.  He saw Dr Jarold Motto yesterday for ongoing chest pain.  Dr Jarold Motto told him that he needed an angioplasty on Wednesday.  The angioplasty is not schedule and was not conveyed to [place].  The chest pain that Mr Parlato has been experiencing is new and requiring Ntg use.  I made patient an appt to be seen today in our office.

## 2013-04-24 NOTE — Assessment & Plan Note (Signed)
Continue Statin.  Recheck Lipids in AM.

## 2013-04-24 NOTE — Assessment & Plan Note (Signed)
New

## 2013-04-24 NOTE — Progress Notes (Signed)
Date:  04/24/2013   ID:  Austin Herman, DOB 01/10/1941, MRN [id number]  PCP:  Garlan Fillers, MD  Primary Cardiologist:  Allyson Sabal     History of Present Illness: Austin Herman is a 72 y.o. male mild to moderately overweight, married Caucasian male, father of 2, grandfather to 4 grandchildren whose wife unfortunately is wheelchair bound with multiple sclerosis which has been progressively worsening.  The patient is the primary caregiver and therefore is under a lot of stress at home. He has a history of CAD status post coronary artery bypass grafting in 1994. His other problems include hypertension and hyperlipidemia.  Dr. Allyson Sabal cath'd him June 26, 2008, revealing a patent LIMA to his LAD, a patent vein to a diagonal branch and a patent sequential vein to OM1 and 2 with an 80% stenosis just beyond the distal insertion and a 70% mid nondominant RCA stenosis with normal LV function. 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 His last Myoview performed July 24, 2010, was nonischemic. Recent lab work performed by Dr. Eloise Harman revealed total cholesterol 193, LDL of 103 and HDL of 32., moderately increased compared to his prior readings.   He last saw Dr. Allyson Sabal on April 04, 2013, at which time he was not experiencing any CP.  He presents today after being seen by Dr. Eloise Harman.  The patient reports CP which started on Thanksgiving. He describes the pain as a pressure also as burning. He said he tried but it was indigestion and he tried to Gelusil just about every time he had the pain. He reports some relief with it. He also reports that this past "Sunday the chest pain woke him up at night. It was 7/10 in intensity and radiated to his left arm and jaw. He tried a nitroglycerin which helped. He continues to have chest pain approximately 2 times  per day. Yesterday was seen [facility] office and was started on Imdur 30 mg, the first dose of which he took today.  He also maintains a little LEE.  The patient currently denies nausea, vomiting, fever,  shortness of breath, orthopnea, dizziness, PND, cough, congestion, abdominal pain, hematochezia, melena, claudication.  Wt Readings from Last 3 Encounters:  04/24/13 227 lb (102.967 kg)  04/11/13 227 lb 3.2 oz (103.057 kg)  04/04/13 228 lb (103.42 kg)     Past Medical History  Diagnosis Date  . Diabetes mellitus   . Hyperlipidemia   . 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 CAD (coronary artery disease)     Hx of CABG 1994, last cath 2010 with patent grafts but graft insertion stenosis, treated medically  . PAD (peripheral artery disease)     75" % lt renal artery stenosis, followed by dopplersand mild carotid stenosis, mild bilaateral  . HTN (hypertension)   . Tobacco chew use 10/17/2012  . OSA (obstructive sleep apnea)     Sleep Study - 03/30/2009 - AHI-40.6/hr, AHI during REM-36.9/hr, RDI-52.4, RDI during REM-36.9/hr, and average O2 sat during REM and NREM-90%.    Current Outpatient Prescriptions  Medication Sig Dispense Refill  . aspirin 325 MG tablet Take 325 mg by mouth daily.      . cetirizine (ZYRTEC) 10 MG tablet Take 10 mg by mouth daily.      Marland Kitchen DIOVAN 160 MG tablet TAKE 1 TABLET BY MOUTH EVERY DAY  30 tablet  11  . doxazosin (CARDURA) 2 MG tablet Take 2 mg by mouth at bedtime.       Marland Kitchen FARXIGA 10 MG TABS Take 10 mg by mouth daily.      . fluticasone (FLONASE) 50 MCG/ACT nasal spray Place 1 spray into the nose as needed.       Marland Kitchen HYDROcodone-acetaminophen (NORCO/VICODIN) 5-325 MG per tablet Take 1 tablet by mouth as needed for moderate pain.      . isosorbide mononitrate (IMDUR) 30 MG 24 hr tablet Take 30 mg by mouth daily.      . metFORMIN (GLUCOPHAGE-XR) 500 MG 24 hr tablet Take 500 mg by mouth 2 (two) times daily.      . metoprolol succinate (TOPROL-XL) 25 MG 24 hr tablet TAKE 1  TABLET BY MOUTH EVERY DAY  30 tablet  10  . Multiple Vitamin (MULTIVITAMIN) capsule Take 1 capsule by mouth daily.      Marland Kitchen NITROSTAT 0.4 MG SL tablet every 5 (five) minutes as needed.       . Omega-3 Fatty Acids (FISH OIL) 1200 MG CAPS Take 1,200 mg by mouth daily.      . simvastatin (ZOCOR) 40 MG tablet Take 40 mg by mouth at bedtime.         No current facility-administered medications for this visit.    Allergies:    Allergies  Allergen Reactions  . Lipitor [Atorvastatin]   . Codeine Rash    Social History:  The patient  reports that he has quit smoking. His smoking use included Cigars. His smokeless tobacco use includes Chew. He reports that he drinks alcohol. He reports that he does not use illicit drugs.   Family history:   Family History  Problem Relation Age of Onset  . Cancer Father   . Cancer Brother     ROS:  Please see the history of present illness.  All other systems reviewed and negative.   PHYSICAL EXAM: VS:  BP 140/70  Pulse 71  Ht 5\' 10"  (1.778 m)  Wt 227 lb (102.967 kg)  BMI 32.57 kg/m2 Obese, well developed, in no acute distress HEENT: Pupils are equal round react to light accommodation extraocular movements are intact.  Neck: no JVDNo cervical lymphadenopathy. Cardiac: Irrgular rate and rhythm without murmurs rubs or gallops. Lungs:  clear to auscultation bilaterally, no wheezing, rhonchi or rales Abd: soft, nontender, positive bowel sounds all quadrants, no hepatosplenomegaly Ext: no lower extremity edema.  2+ radial and dorsalis pedis pulses. Skin: warm and dry Neuro:  Grossly normal  EKG:   New LBBB.  PVCs, 1st degree AVB  ASSESSMENT AND PLAN:  Problem List Items Addressed This Visit   Diabetes mellitus (Chronic)   Relevant Orders      INR/PT      PTT      CBC with Differential      COMPLETE METABOLIC PANEL WITH GFR      Urinalysis, microscopic only      DG Chest 2 View   Tobacco chew use (Chronic)   Relevant Orders      INR/PT       PTT      CBC with Differential      COMPLETE METABOLIC PANEL WITH GFR      Urinalysis, microscopic only      DG Chest 2 View   Unstable angina - Primary     New LBBB.  Pre procedure labs,  Left heart cath tomorrow with Dr. Herbie Baltimore.  Continue  Imdur. I told the patient he can increase the imdur to 60mg  if he has more pain.    Relevant Medications      isosorbide mononitrate (IMDUR) 30 MG 24 hr tablet   Other Relevant Orders      EKG 12-Lead      LEFT HEART CATHETERIZATION WITH CORONARY ANGIOGRAM      INR/PT      PTT      CBC with Differential      COMPLETE METABOLIC PANEL WITH GFR      Urinalysis, microscopic only      DG Chest 2 View   LBBB (left bundle branch block)     New    CAD (coronary artery disease)   Relevant Orders      INR/PT      PTT      CBC with Differential      COMPLETE METABOLIC PANEL WITH GFR      Urinalysis, microscopic only      DG Chest 2 View   PAD (peripheral artery disease)   Relevant Orders      INR/PT      PTT      CBC with Differential      COMPLETE METABOLIC PANEL WITH GFR      Urinalysis, microscopic only      DG Chest 2 View   HTN (hypertension)   Relevant Orders      INR/PT      PTT      CBC with Differential      COMPLETE METABOLIC PANEL WITH GFR      Urinalysis, microscopic only      DG Chest 2 View   Hyperlipidemia     Continue Statin.  Recheck Lipids in AM.    Relevant Orders      INR/PT      PTT      CBC with Differential      COMPLETE METABOLIC PANEL WITH GFR      Urinalysis, microscopic only      DG Chest 2 View    Other Visit Diagnoses   Pre-procedure lab exam        Relevant Orders       INR/PT       PTT       CBC with Differential       COMPLETE METABOLIC PANEL WITH GFR       Urinalysis, microscopic only       DG Chest 2 View

## 2013-04-25 ENCOUNTER — Encounter (HOSPITAL_COMMUNITY): Payer: Self-pay | Admitting: General Practice

## 2013-04-25 ENCOUNTER — Inpatient Hospital Stay (HOSPITAL_COMMUNITY)
Admission: RE | Admit: 2013-04-25 | Discharge: 2013-04-27 | DRG: 249 | Disposition: A | Discharge status: Home / Self Care | Payer: Medicare Other | Source: Ambulatory Visit | Attending: Cardiovascular Disease | Admitting: Cardiovascular Disease

## 2013-04-25 ENCOUNTER — Encounter: Payer: Self-pay | Admitting: Physician Assistant

## 2013-04-25 ENCOUNTER — Encounter: Payer: Self-pay | Admitting: Cardiovascular Disease

## 2013-04-25 ENCOUNTER — Ambulatory Visit (HOSPITAL_COMMUNITY): Payer: Medicare Other

## 2013-04-25 ENCOUNTER — Encounter (HOSPITAL_COMMUNITY)
Admission: RE | Disposition: A | Discharge status: Home / Self Care | Payer: Self-pay | Source: Ambulatory Visit | Attending: Cardiology

## 2013-04-25 DIAGNOSIS — I200 Unstable angina: Secondary | ICD-10-CM | POA: Diagnosis present

## 2013-04-25 DIAGNOSIS — I10 Essential (primary) hypertension: Secondary | ICD-10-CM | POA: Diagnosis present

## 2013-04-25 DIAGNOSIS — I2510 Atherosclerotic heart disease of native coronary artery without angina pectoris: Secondary | ICD-10-CM

## 2013-04-25 DIAGNOSIS — I771 Stricture of artery: Secondary | ICD-10-CM | POA: Diagnosis present

## 2013-04-25 DIAGNOSIS — Z7982 Long term (current) use of aspirin: Secondary | ICD-10-CM

## 2013-04-25 DIAGNOSIS — I6529 Occlusion and stenosis of unspecified carotid artery: Secondary | ICD-10-CM | POA: Diagnosis present

## 2013-04-25 DIAGNOSIS — Z720 Tobacco use: Secondary | ICD-10-CM | POA: Diagnosis present

## 2013-04-25 DIAGNOSIS — F17200 Nicotine dependence, unspecified, uncomplicated: Secondary | ICD-10-CM | POA: Diagnosis present

## 2013-04-25 DIAGNOSIS — I25810 Atherosclerosis of coronary artery bypass graft(s) without angina pectoris: Principal | ICD-10-CM | POA: Diagnosis present

## 2013-04-25 DIAGNOSIS — Z79899 Other long term (current) drug therapy: Secondary | ICD-10-CM

## 2013-04-25 DIAGNOSIS — G4733 Obstructive sleep apnea (adult) (pediatric): Secondary | ICD-10-CM | POA: Diagnosis present

## 2013-04-25 DIAGNOSIS — Z01812 Encounter for preprocedural laboratory examination: Secondary | ICD-10-CM

## 2013-04-25 DIAGNOSIS — E119 Type 2 diabetes mellitus without complications: Secondary | ICD-10-CM | POA: Diagnosis present

## 2013-04-25 DIAGNOSIS — I701 Atherosclerosis of renal artery: Secondary | ICD-10-CM | POA: Diagnosis present

## 2013-04-25 DIAGNOSIS — Z7902 Long term (current) use of antithrombotics/antiplatelets: Secondary | ICD-10-CM

## 2013-04-25 DIAGNOSIS — Z23 Encounter for immunization: Secondary | ICD-10-CM

## 2013-04-25 DIAGNOSIS — E785 Hyperlipidemia, unspecified: Secondary | ICD-10-CM | POA: Diagnosis present

## 2013-04-25 DIAGNOSIS — I739 Peripheral vascular disease, unspecified: Secondary | ICD-10-CM | POA: Diagnosis present

## 2013-04-25 HISTORY — DX: Gastro-esophageal reflux disease without esophagitis: K21.9

## 2013-04-25 HISTORY — DX: Chronic lymphocytic leukemia of B-cell type not having achieved remission: C91.10

## 2013-04-25 HISTORY — DX: Nocturia: R35.1

## 2013-04-25 HISTORY — DX: Other specified postprocedural states: Z98.890

## 2013-04-25 HISTORY — DX: Unspecified osteoarthritis, unspecified site: M19.90

## 2013-04-25 HISTORY — PX: CORONARY ANGIOPLASTY WITH STENT PLACEMENT: SHX49

## 2013-04-25 HISTORY — DX: Dorsalgia, unspecified: M54.9

## 2013-04-25 HISTORY — DX: Nausea with vomiting, unspecified: R11.2

## 2013-04-25 HISTORY — DX: Other chronic pain: G89.29

## 2013-04-25 HISTORY — PX: LEFT HEART CATHETERIZATION WITH CORONARY/GRAFT ANGIOGRAM: SHX5450

## 2013-04-25 HISTORY — DX: Type 2 diabetes mellitus without complications: E11.9

## 2013-04-25 LAB — CBC WITH DIFFERENTIAL/PLATELET
Hemoglobin: 14.8 g/dL (ref 13.0–17.0)
Lymphocytes Relative: 61 % — ABNORMAL HIGH (ref 12–46)
Lymphs Abs: 10.3 10*3/uL — ABNORMAL HIGH (ref 0.7–4.0)
Monocytes Relative: 4 % (ref 3–12)
Neutro Abs: 5.8 10*3/uL (ref 1.7–7.7)
Neutrophils Relative %: 34 % — ABNORMAL LOW (ref 43–77)
Platelets: 167 10*3/uL (ref 150–400)
RBC: 4.95 MIL/uL (ref 4.22–5.81)
RDW: 15.1 % (ref 11.5–15.5)
WBC: 16.9 10*3/uL — ABNORMAL HIGH (ref 4.0–10.5)

## 2013-04-25 LAB — URINALYSIS, MICROSCOPIC ONLY
Crystals: NONE SEEN
Squamous Epithelial / LPF: NONE SEEN

## 2013-04-25 LAB — PROTIME-INR
INR: 0.95 (ref <1.50)
Prothrombin Time: 12.7 seconds (ref 11.6–15.2)

## 2013-04-25 LAB — GLUCOSE, CAPILLARY
Glucose-Capillary: 115 mg/dL — ABNORMAL HIGH (ref 70–99)
Glucose-Capillary: 105 mg/dL — ABNORMAL HIGH (ref 70–99)
Glucose-Capillary: 137 mg/dL — ABNORMAL HIGH (ref 70–99)

## 2013-04-25 LAB — POCT ACTIVATED CLOTTING TIME
Activated Clotting Time: 273 seconds
Activated Clotting Time: 278 seconds

## 2013-04-25 LAB — PATHOLOGIST SMEAR REVIEW

## 2013-04-25 LAB — APTT: aPTT: 34 seconds (ref 24–37)

## 2013-04-25 SURGERY — LEFT HEART CATHETERIZATION WITH CORONARY/GRAFT ANGIOGRAM
Anesthesia: LOCAL

## 2013-04-25 MED ORDER — SODIUM CHLORIDE 0.9 % IV SOLN: 1.0000 mL/kg/h | INTRAVENOUS | Status: AC

## 2013-04-25 MED ORDER — HYDROCODONE-ACETAMINOPHEN 5-325 MG PO TABS: 1.0000 | ORAL_TABLET | Freq: Every day | ORAL | Status: DC | PRN

## 2013-04-25 MED ORDER — ACETAMINOPHEN 325 MG PO TABS
650.0000 mg | ORAL_TABLET | ORAL | Status: DC | PRN
  Administered 2013-04-25: 17:00:00 650 mg via ORAL
  Filled 2013-04-25: qty 2

## 2013-04-25 MED ORDER — FLUTICASONE PROPIONATE 50 MCG/ACT NA SUSP
1.0000 | Freq: Every day | NASAL | Status: DC | PRN
  Filled 2013-04-25: qty 16

## 2013-04-25 MED ORDER — SODIUM CHLORIDE 0.9 % IJ SOLN: 3.0000 mL | INTRAMUSCULAR | Status: DC | PRN

## 2013-04-25 MED ORDER — ASPIRIN 81 MG PO CHEW
81.0000 mg | CHEWABLE_TABLET | Freq: Every day | ORAL | Status: DC
  Administered 2013-04-26 – 2013-04-27 (×2): 81 mg via ORAL
  Filled 2013-04-25 (×3): qty 1

## 2013-04-25 MED ORDER — PNEUMOCOCCAL VAC POLYVALENT 25 MCG/0.5ML IJ INJ
0.5000 mL | INJECTION | INTRAMUSCULAR | Status: DC
  Filled 2013-04-25: qty 0.5

## 2013-04-25 MED ORDER — SODIUM CHLORIDE 0.9 % IJ SOLN: 3.0000 mL | Freq: Two times a day (BID) | INTRAMUSCULAR | Status: DC

## 2013-04-25 MED ORDER — DIAZEPAM 2 MG PO TABS
2.0000 mg | ORAL_TABLET | ORAL | Status: AC
  Administered 2013-04-25: 2 mg via ORAL
  Filled 2013-04-25: qty 1

## 2013-04-25 MED ORDER — SODIUM CHLORIDE 0.9 % IV SOLN: 250.0000 mL | INTRAVENOUS | Status: DC | PRN

## 2013-04-25 MED ORDER — HEPARIN SODIUM (PORCINE) 1000 UNIT/ML IJ SOLN
INTRAMUSCULAR | Status: AC
  Filled 2013-04-25: qty 1

## 2013-04-25 MED ORDER — ASPIRIN 81 MG PO CHEW: 81.0000 mg | CHEWABLE_TABLET | ORAL | Status: DC

## 2013-04-25 MED ORDER — NITROGLYCERIN 0.4 MG SL SUBL: 0.4000 mg | SUBLINGUAL_TABLET | SUBLINGUAL | Status: DC | PRN

## 2013-04-25 MED ORDER — SODIUM CHLORIDE 0.9 % IV SOLN
INTRAVENOUS | Status: DC
  Administered 2013-04-25: 1000 mL via INTRAVENOUS

## 2013-04-25 MED ORDER — ONDANSETRON HCL 4 MG/2ML IJ SOLN: 4.0000 mg | Freq: Four times a day (QID) | INTRAMUSCULAR | Status: DC | PRN

## 2013-04-25 MED ORDER — TICAGRELOR 90 MG PO TABS
ORAL_TABLET | ORAL | Status: AC
  Filled 2013-04-25: qty 2

## 2013-04-25 MED ORDER — TICAGRELOR 90 MG PO TABS
90.0000 mg | ORAL_TABLET | Freq: Two times a day (BID) | ORAL | Status: DC
  Administered 2013-04-25 – 2013-04-27 (×4): 90 mg via ORAL
  Filled 2013-04-25 (×5): qty 1

## 2013-04-25 MED ORDER — SIMVASTATIN 40 MG PO TABS
40.0000 mg | ORAL_TABLET | Freq: Every day | ORAL | Status: DC
  Administered 2013-04-25 – 2013-04-26 (×2): 40 mg via ORAL
  Filled 2013-04-25 (×3): qty 1

## 2013-04-25 MED ORDER — ISOSORBIDE MONONITRATE ER 30 MG PO TB24
30.0000 mg | ORAL_TABLET | Freq: Every day | ORAL | Status: DC
  Administered 2013-04-26 – 2013-04-27 (×2): 30 mg via ORAL
  Filled 2013-04-25 (×3): qty 1

## 2013-04-25 MED ORDER — VERAPAMIL HCL 2.5 MG/ML IV SOLN
INTRAVENOUS | Status: AC
  Filled 2013-04-25: qty 2

## 2013-04-25 MED ORDER — IRBESARTAN 150 MG PO TABS
150.0000 mg | ORAL_TABLET | Freq: Every day | ORAL | Status: DC
  Administered 2013-04-27: 09:00:00 150 mg via ORAL
  Filled 2013-04-25 (×4): qty 1

## 2013-04-25 MED ORDER — FENTANYL CITRATE 0.05 MG/ML IJ SOLN
INTRAMUSCULAR | Status: AC
  Filled 2013-04-25: qty 2

## 2013-04-25 MED ORDER — MIDAZOLAM HCL 2 MG/2ML IJ SOLN
INTRAMUSCULAR | Status: AC
Start: 2013-04-25 — End: 2013-04-25
  Filled 2013-04-25: qty 2

## 2013-04-25 MED ORDER — HEPARIN (PORCINE) IN NACL 2-0.9 UNIT/ML-% IJ SOLN
INTRAMUSCULAR | Status: AC
  Filled 2013-04-25: qty 1000

## 2013-04-25 MED ORDER — NITROGLYCERIN 0.2 MG/ML ON CALL CATH LAB
INTRAVENOUS | Status: AC
  Filled 2013-04-25: qty 1

## 2013-04-25 MED ORDER — METOPROLOL SUCCINATE ER 25 MG PO TB24
25.0000 mg | ORAL_TABLET | Freq: Every day | ORAL | Status: DC
  Administered 2013-04-26 – 2013-04-27 (×2): 25 mg via ORAL
  Filled 2013-04-25 (×3): qty 1

## 2013-04-25 MED ORDER — LIDOCAINE HCL (PF) 1 % IJ SOLN
INTRAMUSCULAR | Status: AC
  Filled 2013-04-25: qty 30

## 2013-04-25 MED ORDER — MORPHINE SULFATE 2 MG/ML IJ SOLN: 2.0000 mg | INTRAMUSCULAR | Status: DC | PRN

## 2013-04-25 NOTE — CV Procedure (Signed)
CARDIAC CATHETERIZATION & PERCUTANEOUS CORONARY INTERVENTION REPORT  NAME:  Austin Herman   MRN: [id number] DOB:  01/10/1941   ADMIT DATE: 04/25/2013 Procedure Date: 04/25/2013  INTERVENTIONAL CARDIOLOGIST: Marykay Lex, M.D., MS PRIMARY CARE PROVIDER: Garlan Fillers, MD PRIMARY CARDIOLOGIST: Runell Gess., MD  PATIENT:  Austin Herman is a 72 y.o. male (pt of Dr. Allyson Sabal) with h/o CAD-CABG who was seen by Mr. Wilburt Finlay as an Urgent Work-in patient after presenting to his PCP with Crescendo Unstable Angina Sx over the past few weeks. 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 It is now referred for invasive evaluation cardiac catheterization plus minus PCI   PRE-OPERATIVE DIAGNOSIS:    Unstable angina (crescendo)  Class III angina  PROCEDURES PERFORMED:    Left Heart Catheterization with Native Coronary and Graft Angiography  Complex 2 lesion PCI of the SVG-OM (proximal and distal) using (5 mm) Spider distal dissection device using 2b5 mm bare-metal stents will be an S4 0.5 mm x 16 mm (proximal) 0.5 mm x 24 mm (distal) -- postdilated to 5 mm diameter  PROCEDURE:Consent:  Risks of procedure as well as the alternatives and risks of each were explained to the (patient/caregiver).  Consent for procedure obtained. Consent for signed by MD and patient with RN witness -- placed on chart.   PROCEDURE: The patient was brought to the 2nd Floor [facility] in the fasting state and prepped and draped in the usual sterile fashon for Left  radial access. A modified Allen's test with plethysmography was performed, revealing excellent Ulnar artery collateral flow.  Sterile technique was used including antiseptics, cap, gloves, gown, hand hygiene, mask and sheet.  Skin prep: Chlorhexidine.  Time Out: Verified patient identification, verified procedure, site/side was marked, verified correct patient  position, special equipment/implants available, medications/allergies/relevent history reviewed, required imaging and test results available.  Performed  Access: Left Radial Artery; 6 Fr Sheath -- Seldinger technique (Angiocath Micropuncture Kit)  IA Radial Cocktail, IV Heparin 4000 units Diagnostic:  JR 4, IMA, JL4 catheters advanced and exchanged over long cystic J-wire  Right Coronary Artery as well as SVG-OM-OM and SVG-diagonal Angiography: JR 4  LIMA-LAD Angiography: IMA catheter  Left Coronary Artery Angiography: JL4  LV Hemodynamics: LCB guide catheter prior to PCI  TR Band:  1140 Hours, 18 mL air, nonocclusive hemostasis  MEDICATIONS:  Anesthesia:  Local Lidocaine 2 ml  Sedation:  2 mg IV Versed, 50 mcg IV fentanyl ;   Premedication: 2 mg oral Valium  Omnipaque Contrast: 255 ml  Anticoagulation:  IV Heparin 1300 Units total (2 boluses of 4000 followed by 1 bolus of 3000 to achieve an ACT of greater than 270 SEC)   Anti-Platelet Agent:  Brilinta 180 mg by mouth Radial Cocktail: 5 mg Verapamil, 400 mcg NTG, 2 ml 2% Lidocaine in 10 ml NS  Hemodynamics:  Central Aortic / Mean Pressures: 131/74 mmHg; 98 mmHg -- pullback into left subclavian had no gradient. Pre-PCI: 153/86 mmHg; 115 mmHg  Left Ventricular Pressures / EDP: 156/21 mmHg; 36 mmHg  Left Ventriculography: Not performed  Coronary Anatomy:  Left Main: Essentially flush occluded.   RCA: Moderate caliber vessel that gives rise to a small PDA. There is mid 40-50% stenosis followed by distal 60% stenosis.\  Graft Angiography:  LIMA-LAD: Widely patent Taxus to the mid LAD with brisk TIMI 3 flow distally. Retrograde flow fills a diagonal.  SVG-OM (second leg as  sequential graft not seen): Proximal 95% focal stenosis. The distal 50% followed by 70% stenosis.  SVG-Diagonal: Mid graft there is a ulcerated, irregular lesion of roughly 80-85%.  After reviewing the initial angiography there are several culprit  lesions but the most prominent would be the proximal SVG-OM. The decision was made to proceed with PCI of the proximal lesion first noted to delineate the severity of the distal lesion. We will perform staged PCI on the other vein graft at a later date. Plans are made to proceed PCI. Spider distal compression devices to be used for the proximal lesion. After an LCB guide was not adequate for coaxial engagement of the graft, this was switched out for an AL-1 guide.  Percutaneous Coronary Intervention:  Lesion 1: 90% proximal SVG-OM reduced to 0%. TIMI 3 flow pre-and post Guide: 6 Fr   AL-1  Guidewire: BMW; the proximal lesion appeared to be to narrow for allowing passage of the deployment device for the Spider filter wire. Therefore this is was made to perform predilation angioplasty with a small balloon. Predilation Balloon: Emerge Monorail 2.0 mm x 15 mm;   6 Atm x 30 Sec, TIMI 3 flow is maintained. With a wire advanced into the distal graft, the spider wired deployment apparatus was advanced over this wire into the mid graft prior to the distal lesion. The spider filter basket was deployed in the deployment sheath removed.  Stent: [employer] BMS  4.5 mm x 16 mm;   Deployed at: 16 Atm x 30 Sec,  Post-dilation With Stent Balloon: 18 mm x 45 mm; Final Diameter - 5 mm  Post-deployment angiography revealed excellent stent apposition. However improved flow distally confirmed the concern for the distal lesions being daily significant. The decision was therefore made to proceed with direct stenting of these lesions.   Lesion #2: Distal SVG-OM tandem lesions of 50% and 70% reduced to 0%. TIMI 3 flow maintained pre-and post. The BMW guidewire was readvanced. Stent: [employer] BMS  4.5 mm x 24 mm;   Deployed at: 18Atm x 45 Sec,  Final Diameter - 5 mm  Post deployment angiography in multiple views, with and without guidewire in place revealed excellent stent deployment and  lesion coverage.  There was no evidence of dissection or perforation.  PATIENT DISPOSITION:    The patient was transferred to the PACU holding area in a hemodynamicaly stable, chest pain free condition.  The patient tolerated the procedure well, and there were no complications.  EBL:   < 20 ml  The patient was stable before, during, and after the procedure.  POST-OPERATIVE DIAGNOSIS:    Severe lesions in both the vein grafts to the diagonal and OM. The most prominent being SVG-OM, therefore the likely culprit lesion treated with 2 bare-metal stents as described.  Moderate disease in the native RCA with widely patent LIMA-LAD.  Elevated LVEDP  PLAN OF CARE:  Admit overnight for post radial cath care.  Will review the films with Dr. Allyson Sabal who will see the patient in the morning and determine whether or not to discharge the patient with plan to return for staged PCI versus perform PCI tomorrow.  Continue home medications with the exception of metformin.  Dual Antiplatelet therapy for a minimum of 1 month, preferably for one year given the unstable nature of the presentation.   Findings in the results were discussed with Dr. Allyson Sabal, who will discuss plans with the patient in the morning.  Marykay Lex, M.D., M.S. Tressie Ellis  [facility] [address] [place], [place]  [address]  [phone]  04/25/2013 11:43 AM

## 2013-04-25 NOTE — Progress Notes (Signed)
TR BAND REMOVAL  LOCATION:  left radial  DEFLATED PER PROTOCOL:  yes  TIME BAND OFF / DRESSING APPLIED:   1630   SITE UPON ARRIVAL:   Level 0  SITE AFTER BAND REMOVAL:  Level 0  REVERSE ALLEN'S TEST:    positive  CIRCULATION SENSATION AND MOVEMENT:  Within Normal Limits  yes  COMMENTS:

## 2013-04-25 NOTE — H&P (Addendum)
History and Physical Interval Note:  NAME:  Austin Herman   MRN: [id number] DOB:  Sep 24, 1940   ADMIT DATE: 04/25/2013   04/25/2013 8:46 AM  Austin Herman is a 72 y.o. male (pt of Dr. Allyson Sabal) with h/o CAD-CABG who was seen by Mr. Wilburt Finlay as an Urgent Work-in patient after presenting to his PCP with Crescendo Unstable Angina Sx over the past few weeks.   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  (See full Clinic Progress Note dated 12/9).  I saw the patient along with Mr. Leron Croak & agree with his plan for cardiac catheterization today.  Past Medical History  Diagnosis Date  . Diabetes mellitus   . Hyperlipidemia   . 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 CAD (coronary artery disease)     Hx of CABG 1994, last cath 2010 with patent grafts but graft insertion stenosis, treated medically  . PAD (peripheral artery disease)     75% lt renal artery stenosis, followed by dopplersand mild carotid stenosis, mild bilaateral  . HTN (hypertension)   . Tobacco chew use 10/17/2012  . OSA (obstructive sleep apnea)     Sleep Study - 03/30/2009 - AHI-40.6/hr, AHI during REM-36.9/hr, RDI-52.4, RDI during REM-36.9/hr, and average O2 sat during REM and NREM-90%.   Past Surgical History  Procedure Laterality Date  . Coronary artery bypass graft  1994  . Cystectomy  2000    left rib cage area   . Cardiac catheterization  1995, 2010    LIMA PATENT,  VG-diag; seq VG-OM1 & 2patent though 80% stenosis just beyond the insertion site   LAST NUC 06/2010 NEGATIVE FOR ISCHEMIA  . Carotid doppler  03/23/2012    R Subclavian 0-49% diameter reduction, Bilat Bulb/Proximal ICAs-no significant diameter reduction, Bilat mid ICAs 50-69% diameter reduction, L Subclavian 50-69% diameter reduction.  . Cardiac catheterization  03/10/1993    Recommendation - CABG  . Coronary artery bypass graft  02/1993    LIMA to LAD, vein graft to firs diag, sequential vein graft to OM1 and OM 2  . Cardiac catheterization  06/26/2008    No intervention.  Continue medical therapy.  . Cardiovascular stress test  07/24/2010    No scintigraphic evidence for inducable myocardial ischemia.ECG positive for ischemia. Exaggerated blood pressure response to exercise.    FAMHx: Family History  Problem Relation Age of Onset  . Cancer Father   . Cancer Brother    SOCHx:  reports that he has quit smoking. His smoking use included Cigars. His smokeless tobacco use includes Chew. He reports that he drinks alcohol. He reports that he does not use illicit drugs.  ALLERGIES: Allergies  Allergen Reactions  . Lipitor [Atorvastatin]   . Codeine Rash    HOME MEDICATIONS: Prescriptions prior to admission  Medication Sig Dispense Refill  . aspirin 325 MG tablet Take 325 mg by mouth daily.      . cetirizine (ZYRTEC) 10 MG tablet Take 10 mg by mouth daily.      . Dapagliflozin Propanediol (FARXIGA) 10 MG TABS Take 10 mg by mouth daily.      Marland Kitchen doxazosin (CARDURA) 2 MG tablet Take 2 mg by mouth at bedtime.       . fluticasone (FLONASE) 50 MCG/ACT nasal spray Place 1 spray into the nose daily as needed for allergies.       Marland Kitchen HYDROcodone-acetaminophen (NORCO/VICODIN) 5-325 MG per tablet Take 1 tablet by mouth daily as needed for moderate pain.       Marland Kitchen  isosorbide mononitrate (IMDUR) 30 MG 24 hr tablet Take 30 mg by mouth daily.      . metFORMIN (GLUCOPHAGE-XR) 500 MG 24 hr tablet Take 500 mg by mouth 2 (two) times daily.      . metoprolol succinate (TOPROL-XL) 25 MG 24 hr tablet Take 25 mg by mouth daily.      . Multiple Vitamin (MULTIVITAMIN WITH MINERALS) TABS tablet Take 1 tablet by mouth daily.      Marland Kitchen NITROSTAT 0.4 MG SL tablet Place 0.4 mg under the tongue every 5 (five) minutes as needed for chest pain.       . Omega-3 Fatty Acids (FISH OIL) 1200 MG CAPS Take 1,200 mg by mouth daily.      . simvastatin (ZOCOR) 40 MG tablet Take 40 mg by mouth at bedtime.        . valsartan (DIOVAN) 160 MG tablet Take 160 mg by mouth daily.        PHYSICAL EXAM:Blood  pressure 148/74, pulse 76, temperature 97.6 F (36.4 C), temperature source Oral, resp. rate 18, height 5\' 10"  (1.778 m), weight 225 lb (102.059 kg), SpO2 98.00%. See Mr. Jasper Riling Clinic note from 12/9.  IMPRESSION & PLAN The patients' history has been reviewed, patient examined, no change in status from most recent note, stable for surgery. I have reviewed the patients' chart and labs. Questions were answered to the patient's satisfaction.   Principal Problem:   Unstable angina Active Problems:   CAD (coronary artery disease)   HTN (hypertension)   Hyperlipidemia   Diabetes mellitus   Tobacco chew use   Austin Herman has presented today for surgery, with the diagnosis of Unstable (Crescendo) Angina. The various methods of treatment have been discussed with the patient and family.   Risks / Complications include, but not limited to: Death, MI, CVA/TIA, VF/VT (with defibrillation), Bradycardia (need for temporary pacer placement), contrast induced nephropathy, bleeding / bruising / hematoma / pseudoaneurysm, vascular or coronary injury (with possible emergent CT or Vascular Surgery), adverse medication reactions, infection.     After consideration of risks, benefits and other options for treatment, the patient has consented to Procedure(s):  LEFT HEART CATHETERIZATION AND NATIVE CORONARY & GRAFTANGIOGRAPHY +/- AD HOC PERCUTANEOUS CORONARY INTERVENTION   as a surgical intervention.   We will proceed with the planned procedure.   HARDING,[facility]ila[facility] [address] [place], [place]  [address]  [phone]  04/25/2013 8:46 AM  Cath Lab Visit (complete for each Cath Lab visit)  Clinical Evaluation Leading to the Procedure:   ACS: no  Non-ACS:    Anginal Classification: CCS III  Anti-ischemic medical therapy: Maximal Therapy (2 or more classes of medications)  Non-Invasive Test Results: No non-invasive testing performed  Prior  CABG: Previous CABG

## 2013-04-26 ENCOUNTER — Other Ambulatory Visit: Payer: Self-pay | Admitting: *Deleted

## 2013-04-26 ENCOUNTER — Encounter: Payer: Self-pay | Admitting: *Deleted

## 2013-04-26 ENCOUNTER — Encounter (HOSPITAL_COMMUNITY)
Admission: RE | Disposition: A | Discharge status: Home / Self Care | Payer: Medicare Other | Source: Ambulatory Visit | Attending: Cardiology

## 2013-04-26 DIAGNOSIS — I2510 Atherosclerotic heart disease of native coronary artery without angina pectoris: Secondary | ICD-10-CM

## 2013-04-26 HISTORY — PX: PERCUTANEOUS CORONARY STENT INTERVENTION (PCI-S): SHX5485

## 2013-04-26 LAB — CBC
Hemoglobin: 13.4 g/dL (ref 13.0–17.0)
MCH: 29.6 pg (ref 26.0–34.0)
MCV: 88.9 fL (ref 78.0–100.0)
Platelets: 134 10*3/uL — ABNORMAL LOW (ref 150–400)
RBC: 4.52 MIL/uL (ref 4.22–5.81)
WBC: 12.9 10*3/uL — ABNORMAL HIGH (ref 4.0–10.5)

## 2013-04-26 LAB — BASIC METABOLIC PANEL
BUN: 21 mg/dL (ref 6–23)
CO2: 28 mEq/L (ref 19–32)
Calcium: 8.6 mg/dL (ref 8.4–10.5)
Chloride: 105 mEq/L (ref 96–112)
GFR calc non Af Amer: 60 mL/min — ABNORMAL LOW (ref >90)
Potassium: 4.4 mEq/L (ref 3.5–5.1)
Sodium: 141 mEq/L (ref 135–145)

## 2013-04-26 LAB — GLUCOSE, CAPILLARY
Glucose-Capillary: 119 mg/dL — ABNORMAL HIGH (ref 70–99)
Glucose-Capillary: 154 mg/dL — ABNORMAL HIGH (ref 70–99)

## 2013-04-26 LAB — POCT ACTIVATED CLOTTING TIME: Activated Clotting Time: 354 seconds

## 2013-04-26 SURGERY — PERCUTANEOUS CORONARY STENT INTERVENTION (PCI-S)
Anesthesia: LOCAL

## 2013-04-26 MED ORDER — BIVALIRUDIN 250 MG IV SOLR
INTRAVENOUS | Status: AC
  Filled 2013-04-26: qty 250

## 2013-04-26 MED ORDER — LIDOCAINE HCL (PF) 1 % IJ SOLN
INTRAMUSCULAR | Status: AC
  Filled 2013-04-26: qty 30

## 2013-04-26 MED ORDER — ONDANSETRON HCL 4 MG/2ML IJ SOLN: 4.0000 mg | Freq: Four times a day (QID) | INTRAMUSCULAR | Status: DC | PRN

## 2013-04-26 MED ORDER — MIDAZOLAM HCL 2 MG/2ML IJ SOLN
INTRAMUSCULAR | Status: AC
  Filled 2013-04-26: qty 2

## 2013-04-26 MED ORDER — SODIUM CHLORIDE 0.9 % IV SOLN: INTRAVENOUS | Status: AC

## 2013-04-26 MED ORDER — DIAZEPAM 5 MG PO TABS
5.0000 mg | ORAL_TABLET | ORAL | Status: AC
  Administered 2013-04-26: 13:00:00 5 mg via ORAL
  Filled 2013-04-26: qty 1

## 2013-04-26 MED ORDER — HEPARIN (PORCINE) IN NACL 2-0.9 UNIT/ML-% IJ SOLN
INTRAMUSCULAR | Status: AC
  Filled 2013-04-26: qty 1000

## 2013-04-26 MED ORDER — ACETAMINOPHEN 325 MG PO TABS: 650.0000 mg | ORAL_TABLET | ORAL | Status: DC | PRN

## 2013-04-26 MED ORDER — SODIUM CHLORIDE 0.9 % IJ SOLN: 3.0000 mL | INTRAMUSCULAR | Status: DC | PRN

## 2013-04-26 MED ORDER — FENTANYL CITRATE 0.05 MG/ML IJ SOLN
INTRAMUSCULAR | Status: AC
  Filled 2013-04-26: qty 2

## 2013-04-26 MED ORDER — INSULIN ASPART 100 UNIT/ML ~~LOC~~ SOLN
0.0000 [IU] | Freq: Three times a day (TID) | SUBCUTANEOUS | Status: DC
  Administered 2013-04-26 – 2013-04-27 (×2): 1 [IU] via SUBCUTANEOUS

## 2013-04-26 MED ORDER — SODIUM CHLORIDE 0.9 % IJ SOLN: 3.0000 mL | Freq: Two times a day (BID) | INTRAMUSCULAR | Status: DC

## 2013-04-26 MED ORDER — SODIUM CHLORIDE 0.9 % IV SOLN
INTRAVENOUS | Status: DC
  Administered 2013-04-26: 09:00:00 via INTRAVENOUS

## 2013-04-26 MED ORDER — NITROGLYCERIN 0.2 MG/ML ON CALL CATH LAB
INTRAVENOUS | Status: AC
  Filled 2013-04-26: qty 1

## 2013-04-26 MED ORDER — TICAGRELOR 90 MG PO TABS: 90.0000 mg | ORAL_TABLET | Freq: Two times a day (BID) | ORAL | Status: DC

## 2013-04-26 MED ORDER — SODIUM CHLORIDE 0.9 % IV SOLN: 250.0000 mL | INTRAVENOUS | Status: DC

## 2013-04-26 MED ORDER — ASPIRIN 81 MG PO CHEW: 81.0000 mg | CHEWABLE_TABLET | ORAL | Status: AC

## 2013-04-26 MED ORDER — ASPIRIN 81 MG PO CHEW: 81.0000 mg | CHEWABLE_TABLET | Freq: Every day | ORAL | Status: DC

## 2013-04-26 NOTE — H&P (View-Only) (Signed)
Subjective: No complaints  Objective: Vital signs in last 24 hours: Temp:  [97.7 F (36.5 C)-98.3 F (36.8 C)] 98 F (36.7 C) (12/11 1610) Pulse Rate:  [58-75] 73 (12/11 0608) Resp:  [18-20] 20 (12/11 9604) BP: (133-172)/(71-79) 172/71 mmHg (12/11 0608) SpO2:  [96 %-98 %] 98 % (12/11 5409) Weight:  [222 lb 10.6 oz (101 kg)] 222 lb 10.6 oz (101 kg) (12/11 0100) Weight change:  Last BM Date: 04/24/13 Intake/Output from previous day: +480 12/10 0701 - 12/11 0700 In: 480 [P.O.:480] Out: -  Intake/Output this shift:    PE: General:Pleasant affect, NAD Skin:Warm and dry, brisk capillary refill HEENT:normocephalic, sclera clear, mucus membranes moist Neck:supple, no JVD, no bruits  Heart:S1S2 RRR without murmur, gallup, rub or click Lungs:clear without rales, rhonchi, or wheezes WJX:BJYN, non tender, + BS, do not palpate liver spleen or masses Ext:no lower ext edema, 2+ pedal pulses, 2+ radial pulses Neuro:alert and oriented, MAE, follows commands, + facial symmetry   Lab Results:  Recent Labs  04/24/13 1419 04/26/13 0510  WBC 16.9* 12.9*  HGB 14.8 13.4  HCT 43.6 40.2  PLT 167 134*   BMET  Recent Labs  04/24/13 1419 04/26/13 0510  NA 141 141  K 5.2 4.4  CL 101 105  CO2 28 28  GLUCOSE 142* 127*  BUN 28* 21  CREATININE 1.15 1.19  CALCIUM 9.8 8.6   No results found for this basename: TROPONINI, CK, MB,  in the last 72 hours  No results found for this basename: CHOL, HDL, LDLCALC, LDLDIRECT, TRIG, CHOLHDL   No results found for this basename: HGBA1C     No results found for this basename: TSH    Hepatic Function Panel  Recent Labs  04/24/13 1419  PROT 7.3  ALBUMIN 4.4  AST 19  ALT 30  ALKPHOS 85  BILITOT 0.8   No results found for this basename: CHOL,  in the last 72 hours No results found for this basename: PROTIME,  in the last 72 hours     Studies/Results: Cath: Coronary Anatomy:  Left Main: Essentially flush  occluded. RCA: Moderate caliber vessel that gives rise to a small PDA. There is mid 40-50% stenosis followed by distal 60% stenosis.\  Graft Angiography:  LIMA-LAD: Widely patent Taxus to the mid LAD with brisk TIMI 3 flow distally. Retrograde flow fills a diagonal.  SVG-OM (second leg as sequential graft not seen): Proximal 95% focal stenosis. The distal 50% followed by 70% stenosis.  SVG-Diagonal: Mid graft there is a ulcerated, irregular lesion of roughly 80-85%.  Lesion 1: 90% proximal SVG-OM reduced to 0% Stent: [employer] BMS 4.5 mm x 16 mm  Lesion #2: Distal SVG-OM tandem lesions of 50% and 70% reduced to 0%.  Stent: [employer] BMS     Medications: I have reviewed the patient's current medications. Scheduled Meds: . aspirin  81 mg Oral Daily  . irbesartan  150 mg Oral Daily  . isosorbide mononitrate  30 mg Oral Daily  . metoprolol succinate  25 mg Oral Daily  . pneumococcal 23 valent vaccine  0.5 mL Intramuscular Tomorrow-1000  . simvastatin  40 mg Oral QHS  . Ticagrelor  90 mg Oral BID   Continuous Infusions:  PRN Meds:.acetaminophen, fluticasone, HYDROcodone-acetaminophen, morphine injection, nitroGLYCERIN, ondansetron (ZOFRAN) IV  Assessment/Plan: Principal Problem:   Unstable angina Active Problems:   CAD (coronary artery disease)   HTN (hypertension)   Hyperlipidemia   Diabetes mellitus  Tobacco chew use  PLAN: per Dr. Allyson Sabal  LOS: 1 day   Time spent with pt. : 15 minutes. [facility] R  Nurse Practitioner Certified Pager [phone] or after 5pm and on weekends call [phone] 04/26/2013, 7:50 AM   Agree with note written by Nada Boozer RNP  Admitted with ACS. Enz neg. S/P cath by [facility] via LRA. Disease in SVG to OM (Culprit lesion) stented with BMS in 2 places. Remaining high grade disease SVG diag and native RCA. Plan staged PCI today. On DAPT. No CP last PM. Exam benign.    Runell Gess 04/26/2013 8:02 AM

## 2013-04-26 NOTE — CV Procedure (Signed)
Austin Herman is a 72 y.o. male    [id number] LOCATION:  FACILITY: [facility]  PHYSICIAN: Nanetta Batty, M.D. 1940/07/14   DATE OF PROCEDURE:  04/26/2013  DATE OF DISCHARGE:     CARDIAC CATHETERIZATION     History obtained from chart review.The patient is a very pleasant 72 year old, mild to moderately overweight, married Caucasian male, father of 2, grandfather to 4 grandchildren whose wife unfortunately is wheelchair bound with multiple sclerosis which has been progressively worsening. The patient is the primary caregiver and therefore is under a lot of stress at home. He has a history of CAD status post coronary artery bypass grafting in 1994. His other problems include hypertension and hyperlipidemia. He denied chest pain or shortness of breath. I cath'd him June 26, 2008, revealing a patent LIMA to his LAD, a patent vein to a diagonal branch and a patent sequential vein to OM1 and 2 with an 80% stenosis just beyond the distal insertion and a 70% mid nondominant RCA stenosis with normal LV function. 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 His last Myoview performed July 24, 2010, was nonischemic. He was admitted 04/25/13 with accelerated angina. He ruled out for myocardial infarction. He underwent cardiac catheterization by Dr. Bryan Lemma revealing a 95% proximal OM 1 SVG stenosis which was stented with a bare-metal stent. He had an 80% ulcerated plaque in the mid shaft of his diagonal branch vein graft. He also had 75% distal RCA stenosis at the takeoff of the PDA with damping at the ostium. Present now for staged diagonal branch SVG PCI   PROCEDURE DESCRIPTION:   The patient was brought to the second floor [place] [facility] cath lab in the postabsorptive state. He was premedicated with Valium 5 mg by mouth, IV Versed and  fentanyl. His right groin was prepped and shaved in usual sterile fashion. Xylocaine 1% was used for local anesthesia. A 6 French sheath was inserted into the right common femoral  artery using standard Seldinger technique. A 5 French right Judkins diagnostic catheter was used to obtain angiography of the native RCA demonstrating at most 75% stenosis at the bifurcation of the PDA and PLA branch.  HEMODYNAMICS:    AO SYSTOLIC/AO DIASTOLIC: 163/76     ANGIOGRAPHIC RESULTS:   The patient was on aspirin and Brilenta . Angiomax bolus was administered with an ACT of 324. Total contrast administered the patient was 95 cc. Using a 6 [place] AL-1 guide catheter along with an 014/190 cm long pro-water guidewire and a 4 mm spider distal protection device primary stenting was performed. Using a 4 mm x 16 mm long rebel [employer] bare-metal stent primary stenting was performed. The stent was deployed at 16 atmospheres (4.54 mm) resulting reduction of a 80-90% ulcerated plaque to 0% residual with some "plaque prolapse". The spider distal protection device was then captured at completion angiography performed revealing a widely patent stent and distal vessel.   IMPRESSION:successful direct stenting of an ulcerated plaque within the body of the diagonal branch SVG with excellent distal flow at the end of the case. Patient did have some chest pain and some subtle EKG changes. The guide wire and catheter were removed. The sheath was secured. The patient left the Cath Lab in stable condition. The sheath will be removed in 3 hours. The patient will be hydrated  overnight, and discharged home in the morning on dual antiplatelet therapy. Presently the treat the remaining distal RCA lesion medically at this time.  Runell Gess MD, [facility] 04/26/2013 2:15 PM

## 2013-04-26 NOTE — Progress Notes (Signed)
Pt got up and ambulated to bathroom for BM less than 2 hours after sheath pull.  Reminded pt risk of fatal bleeding from groin from getting up before 4 hrs post sheath pull. Rt groin and left radial level 0, drsg d&i.  Remained on bedrest rest of time, groin remains level 0.  Drsg removed, bandaid applied.  Post cath radial and groin instructions given and reviewed w/ pt who voiced understanding.  VSS, denies complaints.

## 2013-04-26 NOTE — Progress Notes (Signed)
CARDIAC REHAB PHASE I   PRE:  Rate/Rhythm: 80 SR with PVCs    BP: sitting 180/90 manual left arm    SaO2:   MODE:  Ambulation: 500 ft   POST:  Rate/Rhythm: 88 SR with PVCs    BP: sitting 168/90 manual right arm    SaO2:    BP elevated this am. No c/o. Tolerated slow easy walk well, sts he actually feels better walking. More stressed in room. Began ed. Pt cares for his wife who has MS. Has recently been trying to make better decisions regarding diet. Interested in [facility] and will send referral to [place]. [phone]  Austin Herman CES, ACSM 04/26/2013 8:41 AM

## 2013-04-26 NOTE — Progress Notes (Signed)
Site area: right groin  Site Prior to Removal:  Level 0  Pressure Applied For 20 MINUTES    Minutes Beginning at 1625  Manual:   yes  Patient Status During Pull:  stable  Post Pull Groin Site:  Level 0  Post Pull Instructions Given:  yes  Post Pull Pulses Present:  yes  Dressing Applied:  yes  Comments:  Rechecked in 30 minutes at 1715 and gauze pressure dressing dry and intact dorsalis pedis palpable and unchanged

## 2013-04-26 NOTE — Progress Notes (Signed)
       Subjective: No complaints  Objective: Vital signs in last 24 hours: Temp:  [97.7 F (36.5 C)-98.3 F (36.8 C)] 98 F (36.7 C) (12/11 0608) Pulse Rate:  [58-75] 73 (12/11 0608) Resp:  [18-20] 20 (12/11 0608) BP: (133-172)/(71-79) 172/71 mmHg (12/11 0608) SpO2:  [96 %-98 %] 98 % (12/11 0608) Weight:  [222 lb 10.6 oz (101 kg)] 222 lb 10.6 oz (101 kg) (12/11 0100) Weight change:  Last BM Date: 04/24/13 Intake/Output from previous day: +480 12/10 0701 - 12/11 0700 In: 480 [P.O.:480] Out: -  Intake/Output this shift:    PE: General:Pleasant affect, NAD Skin:Warm and dry, brisk capillary refill HEENT:normocephalic, sclera clear, mucus membranes moist Neck:supple, no JVD, no bruits  Heart:S1S2 RRR without murmur, gallup, rub or click Lungs:clear without rales, rhonchi, or wheezes Abd:soft, non tender, + BS, do not palpate liver spleen or masses Ext:no lower ext edema, 2+ pedal pulses, 2+ radial pulses Neuro:alert and oriented, MAE, follows commands, + facial symmetry   Lab Results:  Recent Labs  04/24/13 1419 04/26/13 0510  WBC 16.9* 12.9*  HGB 14.8 13.4  HCT 43.6 40.2  PLT 167 134*   BMET  Recent Labs  04/24/13 1419 04/26/13 0510  NA 141 141  K 5.2 4.4  CL 101 105  CO2 28 28  GLUCOSE 142* 127*  BUN 28* 21  CREATININE 1.15 1.19  CALCIUM 9.8 8.6   No results found for this basename: TROPONINI, CK, MB,  in the last 72 hours  No results found for this basename: CHOL, HDL, LDLCALC, LDLDIRECT, TRIG, CHOLHDL   No results found for this basename: HGBA1C     No results found for this basename: TSH    Hepatic Function Panel  Recent Labs  04/24/13 1419  PROT 7.3  ALBUMIN 4.4  AST 19  ALT 30  ALKPHOS 85  BILITOT 0.8   No results found for this basename: CHOL,  in the last 72 hours No results found for this basename: PROTIME,  in the last 72 hours     Studies/Results: Cath: Coronary Anatomy:  Left Main: Essentially flush  occluded. RCA: Moderate caliber vessel that gives rise to a small PDA. There is mid 40-50% stenosis followed by distal 60% stenosis.\  Graft Angiography:  LIMA-LAD: Widely patent Taxus to the mid LAD with brisk TIMI 3 flow distally. Retrograde flow fills a diagonal.  SVG-OM (second leg as sequential graft not seen): Proximal 95% focal stenosis. The distal 50% followed by 70% stenosis.  SVG-Diagonal: Mid graft there is a ulcerated, irregular lesion of roughly 80-85%.  Lesion 1: 90% proximal SVG-OM reduced to 0% Stent: [employer] Rebel BMS 4.5 mm x 16 mm  Lesion #2: Distal SVG-OM tandem lesions of 50% and 70% reduced to 0%.  Stent: [employer] Rebel BMS     Medications: I have reviewed the patient's current medications. Scheduled Meds: . aspirin  81 mg Oral Daily  . irbesartan  150 mg Oral Daily  . isosorbide mononitrate  30 mg Oral Daily  . metoprolol succinate  25 mg Oral Daily  . pneumococcal 23 valent vaccine  0.5 mL Intramuscular Tomorrow-1000  . simvastatin  40 mg Oral QHS  . Ticagrelor  90 mg Oral BID   Continuous Infusions:  PRN Meds:.acetaminophen, fluticasone, HYDROcodone-acetaminophen, morphine injection, nitroGLYCERIN, ondansetron (ZOFRAN) IV  Assessment/Plan: Principal Problem:   Unstable angina Active Problems:   CAD (coronary artery disease)   HTN (hypertension)   Hyperlipidemia   Diabetes mellitus     Tobacco chew use  PLAN: per Dr. Duaa Stelzner  LOS: 1 day   Time spent with pt. : 15 minutes. INGOLD,LAURA R  Nurse Practitioner Certified Pager [phone] or after 5pm and on weekends call [phone] 04/26/2013, 7:50 AM   Agree with note written by Laura Ingold RNP  Admitted with ACS. Enz neg. S/P cath by DH via LRA. Disease in SVG to OM (Culprit lesion) stented with BMS in 2 places. Remaining high grade disease SVG diag and native RCA. Plan staged PCI today. On DAPT. No CP last PM. Exam benign.    Talicia Sui J 04/26/2013 8:02 AM   

## 2013-04-26 NOTE — Interval H&P Note (Signed)
Cath Lab Visit (complete for each Cath Lab visit)  Clinical Evaluation Leading to the Procedure:   ACS: yes  Non-ACS:    Anginal Classification: CCS IV  Anti-ischemic medical therapy: Maximal Therapy (2 or more classes of medications)  Non-Invasive Test Results: No non-invasive testing performed  Prior CABG: Previous CABG      History and Physical Interval Note:  04/26/2013 1:15 PM  Austin Herman  has presented today for surgery, with the diagnosis of blockage  The various methods of treatment have been discussed with the patient and family. After consideration of risks, benefits and other options for treatment, the patient has consented to  Procedure(s): PERCUTANEOUS CORONARY STENT INTERVENTION (PCI-S) (N/A) as a surgical intervention .  The patient's history has been reviewed, patient examined, no change in status, stable for surgery.  I have reviewed the patient's chart and labs.  Questions were answered to the patient's satisfaction.     Runell Gess

## 2013-04-27 DIAGNOSIS — I200 Unstable angina: Secondary | ICD-10-CM

## 2013-04-27 DIAGNOSIS — Z01812 Encounter for preprocedural laboratory examination: Secondary | ICD-10-CM

## 2013-04-27 LAB — BASIC METABOLIC PANEL
BUN: 14 mg/dL (ref 6–23)
Chloride: 108 mEq/L (ref 96–112)
Creatinine, Ser: 1.11 mg/dL (ref 0.50–1.35)
GFR calc Af Amer: 75 mL/min — ABNORMAL LOW (ref >90)
GFR calc non Af Amer: 65 mL/min — ABNORMAL LOW (ref >90)
Glucose, Bld: 148 mg/dL — ABNORMAL HIGH (ref 70–99)
Sodium: 142 mEq/L (ref 135–145)

## 2013-04-27 LAB — CBC
HCT: 40.7 % (ref 39.0–52.0)
Hemoglobin: 13.4 g/dL (ref 13.0–17.0)
MCH: 29.6 pg (ref 26.0–34.0)
MCV: 90 fL (ref 78.0–100.0)
RBC: 4.52 MIL/uL (ref 4.22–5.81)

## 2013-04-27 LAB — GLUCOSE, CAPILLARY

## 2013-04-27 MED ORDER — TICAGRELOR 90 MG PO TABS: 90.0000 mg | ORAL_TABLET | Freq: Two times a day (BID) | ORAL | Status: DC

## 2013-04-27 MED ORDER — ASPIRIN 81 MG PO CHEW: 81.0000 mg | CHEWABLE_TABLET | Freq: Every day | ORAL | Status: DC

## 2013-04-27 MED FILL — Sodium Chloride IV Soln 0.9%: INTRAVENOUS | Qty: 50 | Status: AC

## 2013-04-27 NOTE — Progress Notes (Signed)
CARDIAC REHAB PHASE I   Pt walked independently earlier this am. HR noted to be significantly lower today than yesterday at rest. No PVCs, occ PAC. Ed completed emphasizing increased ex. Voiced understanding. Pt planning to make some diet change. Sts he chews tobacco infrequently but plans to quit completely.  [phone]  Elissa Lovett [place] CES, ACSM 04/27/2013 8:12 AM

## 2013-04-27 NOTE — Progress Notes (Signed)
Late entry: Copay $45.00/30 day supply - no prior auth is required.

## 2013-04-27 NOTE — Care Management Note (Signed)
    Page 1 of 1   04/27/2013     2:07:22 PM   CARE MANAGEMENT NOTE 04/27/2013  Patient:  Austin Herman, Austin Herman   Account Number:  [id number]  Date Initiated:  04/27/2013  Documentation initiated by:  Oletta Cohn  Subjective/Objective Assessment:   72 y.o. male (pt of Dr. Allyson Sabal) with h/o CAD-CABG who was seen by Mr. Wilburt Finlay as an Urgent Work-in patient after presenting to PCP w/ [facility] over the past few weeks. / Home with spouse     Action/Plan:   LEFT HEART CATHETERIZATION AND NATIVE CORONARY & GRAFTANGIOGRAPHY +/- AD HOC PERCUTANEOUS CORONARY INTERVENTION//Benefits check for Brilinta   Anticipated DC Date:  04/27/2013   Anticipated DC Plan:  HOME/SELF CARE      DC Planning Services  CM consult      Choice offered to / List presented to:             Status of service:   Medicare Important Message given?   (If response is "NO", the following Medicare IM given date fields will be blank) Date Medicare IM given:   Date Additional Medicare IM given:    Discharge Disposition:    Per UR Regulation:    If discussed at Long Length of Stay Meetings, dates discussed:    Comments:  04/27/13 1045 Camellia Wood, RN, BSN, [employer] with pt at bedside regarding benefits check for [employer].  Pt has brochure with 30 day free card and refill assistance card intact.  Pt utilizes [facility] on [address] for prescription needs.  NCM called pharmacy to confirm availability of medication.  Information relayed to pt.  Pt verbalizes importance of filling medication upon discharge.

## 2013-04-27 NOTE — Discharge Summary (Signed)
And and Physician Discharge Summary  Patient ID: Austin Herman MRN: [id number] DOB/AGE: Apr 15, 1941 72 y.o.  Admit date: 04/25/2013 Discharge date: 04/27/2013  Admission Diagnoses: Unstable Angina   Discharge Diagnoses:  Principal Problem:   Unstable angina Active Problems:   CAD - CABG in 1994 - s/p PCI + BMS to SVG-OM and SVG-diagonal branch 04/26/13   HTN (hypertension)   Hyperlipidemia   Diabetes mellitus   Tobacco chew use   Discharged Condition: stable  Hospital Course: The patient is a 72 y/o male, followed by Dr. Allyson Sabal. He has a history of CAD, status post coronary artery bypass grafting in 1994. His other problems include hypertension and hyperlipidemia. He underwent a left heart cath on June 26, 2008, revealing a patent LIMA to his LAD, a patent vein to a diagonal branch and a patent sequential vein to OM1 and 2 with an 80% stenosis just beyond the distal insertion and a 70% mid nondominant RCA stenosis with normal LV function. 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 His last Myoview performed July 24, 2010, was nonischemic.   He was admitted 04/25/13 with accelerated angina. He ruled out for myocardial infarction. He underwent cardiac catheterization by Dr. Bryan Lemma on 04/25/13, revealing a 95% proximal OM 1 SVG stenosis which was stented with a bare-metal stent. He had an 80% ulcerated plaque in the mid shaft of his diagonal branch vein graft. He also had 75% distal RCA stenosis at the takeoff of the PDA with damping at the ostium. He was taken back to the cath lab by Dr. Allyson Sabal on 04/26/13 for staged diagonal branch SVG PCI, which was treated with a BMS. He tolerated the procedure well. He was placed on DAPT with ASA + Brilinta. The right femoral and right radial access sites remained stable. He had no  further chest pain. He had no difficulty ambulating. He was last seen and examined by Dr.Berry, who determined he was stable for discharge. He will f/u in clinic with Dr. Allyson Sabal on 05/14/13.   Consults: None  Significant Diagnostic Studies:   LHC 04/25/13 Hemodynamics:  Central Aortic / Mean Pressures: 131/74 mmHg; 98 mmHg -- pullback into left subclavian had no gradient. Pre-PCI: 153/86 mmHg; 115 mmHg  Left Ventricular Pressures / EDP: 156/21 mmHg; 36 mmHg Left Ventriculography: Not performed  Coronary Anatomy:  Left Main: Essentially flush occluded. RCA: Moderate caliber vessel that gives rise to a small PDA. There is mid 40-50% stenosis followed by distal 60% stenosis.\  Graft Angiography:  LIMA-LAD: Widely patent Taxus to the mid LAD with brisk TIMI 3 flow distally. Retrograde flow fills a diagonal.  SVG-OM (second leg as sequential graft not seen): Proximal 95% focal stenosis. The distal 50% followed by 70% stenosis.  SVG-Diagonal: Mid graft there is a ulcerated, irregular lesion of roughly 80-85%.   Treatments: See Hospital Course  Discharge Exam: Blood pressure 144/74, pulse 68, temperature 97.9 F (36.6 C), temperature source Oral, resp. rate 18, height 5\' 10"  (1.778 m), weight 219 lb (99.338 kg), SpO2 96.00%.   Disposition: 01-Home or Self Care      Discharge Orders   Future Appointments Provider Department Dept Phone   05/14/2013 9:45 AM Runell Gess, MD [facility] [phone]   07/05/2013 9:00 AM [facility] [phone]   07/05/2013 9:30  AM Benjiman Core, MD [facility] [phone]   Future Orders Complete By Expires   Amb Referral to Cardiac Rehabilitation  As directed    Diet - low sodium heart healthy  As directed    Discharge instructions  As directed    Comments:     Wait until Sunday 04/29/13 to resume Metformin   Driving Restrictions  As directed    Comments:      No driving for 2 days   Increase activity slowly  As directed    Lifting restrictions  As directed    Comments:     No lifting for 2 days       Medication List    STOP taking these medications       aspirin 325 MG tablet  Replaced by:  aspirin 81 MG chewable tablet      TAKE these medications       aspirin 81 MG chewable tablet  Chew 1 tablet (81 mg total) by mouth daily.     cetirizine 10 MG tablet  Commonly known as:  ZYRTEC  Take 10 mg by mouth daily.     doxazosin 2 MG tablet  Commonly known as:  CARDURA  Take 2 mg by mouth at bedtime.     FARXIGA 10 MG Tabs  Generic drug:  Dapagliflozin Propanediol  Take 10 mg by mouth daily.     Fish Oil 1200 MG Caps  Take 1,200 mg by mouth daily.     fluticasone 50 MCG/ACT nasal spray  Commonly known as:  FLONASE  Place 1 spray into the nose daily as needed for allergies.     HYDROcodone-acetaminophen 5-325 MG per tablet  Commonly known as:  NORCO/VICODIN  Take 1 tablet by mouth daily as needed for moderate pain.     isosorbide mononitrate 30 MG 24 hr tablet  Commonly known as:  IMDUR  Take 30 mg by mouth daily.     metFORMIN 500 MG 24 hr tablet  Commonly known as:  GLUCOPHAGE-XR  Take 500 mg by mouth 2 (two) times daily.     metoprolol succinate 25 MG 24 hr tablet  Commonly known as:  TOPROL-XL  Take 25 mg by mouth daily.     multivitamin with minerals Tabs tablet  Take 1 tablet by mouth daily.     NITROSTAT 0.4 MG SL tablet  Generic drug:  nitroGLYCERIN  Place 0.4 mg under the tongue every 5 (five) minutes as needed for chest pain.     Ticagrelor 90 MG Tabs tablet  Commonly known as:  BRILINTA  Take 1 tablet (90 mg total) by mouth 2 (two) times daily.     Ticagrelor 90 MG Tabs tablet  Commonly known as:  BRILINTA  Take 1 tablet (90 mg total) by mouth 2 (two) times daily.     valsartan 160 MG tablet  Commonly known as:  DIOVAN  Take 160 mg by mouth daily.     ZOCOR 40 MG tablet  Generic drug:   simvastatin  Take 40 mg by mouth at bedtime.       Follow-up Information   Follow up with Runell Gess, MD On 06/14/2013. (9:45 am)    Specialty:  Cardiology   Contact information:   [address] [place] [address] [phone]      TIME SPENT ON DISCHARGE, INCLUDING PHYSICIAN TIME: >30 MINUTES Signed: Robbie Lis, PA-C 04/27/2013, 10:38 AM

## 2013-04-27 NOTE — Progress Notes (Signed)
Subjective: Denies any further chest pain. No difficulty ambulating. Denies right groin, back or flank pain. No right wrist pain. Ready for discharge.   Objective: Vital signs in last 24 hours: Temp:  [97.6 F (36.4 C)-98.3 F (36.8 C)] 97.6 F (36.4 C) (12/12 0001) Pulse Rate:  [27-79] 68 (12/12 0001) Resp:  [18] 18 (12/12 0001) BP: (119-180)/(50-90) 144/74 mmHg (12/12 0001) SpO2:  [96 %-98 %] 96 % (12/12 0001) Weight:  [219 lb (99.338 kg)] 219 lb (99.338 kg) (12/11 0839) Last BM Date: 04/26/13  Intake/Output from previous day: 12/11 0701 - 12/12 0700 In: 910 [P.O.:460; I.V.:450] Out: 700 [Urine:700] Intake/Output this shift:    Medications Current Facility-Administered Medications  Medication Dose Route Frequency Provider Last Rate Last Dose  . acetaminophen (TYLENOL) tablet 650 mg  650 mg Oral Q4H PRN Runell Gess, MD      . aspirin chewable tablet 81 mg  81 mg Oral Daily Marykay Lex, MD   81 mg at 04/26/13 2130  . fluticasone (FLONASE) 50 MCG/ACT nasal spray 1 spray  1 spray Each Nare Daily PRN Marykay Lex, MD      . HYDROcodone-acetaminophen (NORCO/VICODIN) 5-325 MG per tablet 1 tablet  1 tablet Oral Daily PRN Marykay Lex, MD      . insulin aspart (novoLOG) injection 0-9 Units  0-9 Units Subcutaneous TID WC Nada Boozer, NP   1 Units at 04/26/13 2000  . irbesartan (AVAPRO) tablet 150 mg  150 mg Oral Daily Marykay Lex, MD      . isosorbide mononitrate (IMDUR) 24 hr tablet 30 mg  30 mg Oral Daily Marykay Lex, MD   30 mg at 04/26/13 8657  . metoprolol succinate (TOPROL-XL) 24 hr tablet 25 mg  25 mg Oral Daily Marykay Lex, MD   25 mg at 04/26/13 8469  . nitroGLYCERIN (NITROSTAT) SL tablet 0.4 mg  0.4 mg Sublingual Q5 min PRN Marykay Lex, MD      . ondansetron [facility]) injection 4 mg  4 mg Intravenous Q6H PRN Marykay Lex, MD      . simvastatin (ZOCOR) tablet 40 mg  40 mg Oral QHS Marykay Lex, MD   40 mg at 04/26/13 2202  . Ticagrelor  (BRILINTA) tablet 90 mg  90 mg Oral BID Marykay Lex, MD   90 mg at 04/26/13 2202    PE: General appearance: alert, cooperative and no distress Lungs: clear to auscultation bilaterally Heart: regular rate and rhythm, S1, S2 normal, no murmur, click, rub or gallop Extremities: trace LEE, right groin: no ecchymosis, nontender, no hematoma or bruit.  Pulses: 2+ and symmetric Skin: warm and dry Neurologic: Grossly normal  Lab Results:   Recent Labs  04/24/13 1419 04/26/13 0510 04/27/13 0530  WBC 16.9* 12.9* 13.6*  HGB 14.8 13.4 13.4  HCT 43.6 40.2 40.7  PLT 167 134* 133*   BMET  Recent Labs  04/24/13 1419 04/26/13 0510 04/27/13 0530  NA 141 141 142  K 5.2 4.4 4.8  CL 101 105 108  CO2 28 28 26   GLUCOSE 142* 127* 148*  BUN 28* 21 14  CREATININE 1.15 1.19 1.11  CALCIUM 9.8 8.6 8.9   PT/INR  Recent Labs  04/24/13 1419  LABPROT 12.7  INR 0.95   Studies/Results:  LHC 04/26/13   HEMODYNAMICS:  AO SYSTOLIC/AO DIASTOLIC: 163/76  ANGIOGRAPHIC RESULTS:  The patient was on aspirin and Brilenta . Angiomax bolus was administered with an ACT of  324. Total contrast administered the patient was 95 cc. Using a 6 [place] AL-1 guide catheter along with an 014/190 cm long pro-water guidewire and a 4 mm spider distal protection device primary stenting was performed. Using a 4 mm x 16 mm long rebel [employer] bare-metal stent primary stenting was performed. The stent was deployed at 16 atmospheres (4.54 mm) resulting reduction of a 80-90% ulcerated plaque to 0% residual with some "plaque prolapse". The spider distal protection device was then captured at completion angiography performed revealing a widely patent stent and distal vessel.    Assessment/Plan  Principal Problem:   Unstable angina Active Problems:   CAD - CABG in 1994 - s/p PCI + BMS to SVG-diagonal branch 04/26/13   HTN (hypertension)   Hyperlipidemia   Diabetes mellitus   Tobacco chew use  Plan:  S/p  successful direct stenting of an ulcerated plaque within the body of the diagonal branch SVG, using a BMS. Also received 2 BMS in the SVG-OM 2 days ago. There is moderate disease in the native RCA with widely patent LIMA-LAD. Will continue to treat RCA medically. No further chest pain. On DAPT with ASA + Brilinta. Also on Toprol 25mg , Zocor 40 mg, Avapro 150 mg and Imdur 30 mg. Right radial and right femoral access sites stable. No bruit or hematoma near right groin. Ambulating w/o difficulty. BP and HR stable. Will plan for discharge today.       LOS: 2 days    Brittainy M. Sharol Harness, PA-C 04/27/2013 7:55 AM   Stable for D/C. Groin looks great. No CP. D.C home. ROV 1-2 weeks. DAPT.  Agree with note written by Boyce Medici  [facility]  Daniil Labarge J 04/27/2013 8:50 AM

## 2013-05-02 ENCOUNTER — Telehealth (HOSPITAL_COMMUNITY): Payer: Self-pay | Admitting: *Deleted

## 2013-05-14 ENCOUNTER — Ambulatory Visit (INDEPENDENT_AMBULATORY_CARE_PROVIDER_SITE_OTHER): Payer: Medicare Other | Admitting: Cardiovascular Disease

## 2013-05-14 ENCOUNTER — Encounter: Payer: Self-pay | Admitting: Cardiovascular Disease

## 2013-05-14 VITALS — BP 120/52 | HR 53 | Ht 70.0 in | Wt 224.2 lb

## 2013-05-14 DIAGNOSIS — I10 Essential (primary) hypertension: Secondary | ICD-10-CM

## 2013-05-14 DIAGNOSIS — E785 Hyperlipidemia, unspecified: Secondary | ICD-10-CM

## 2013-05-14 DIAGNOSIS — I2510 Atherosclerotic heart disease of native coronary artery without angina pectoris: Secondary | ICD-10-CM

## 2013-05-14 DIAGNOSIS — I739 Peripheral vascular disease, unspecified: Secondary | ICD-10-CM

## 2013-05-14 NOTE — Assessment & Plan Note (Signed)
Current hospital/10/14 was unstable angina. He underwent cardiac catheterization by Dr. Bryan Lemma revealing high-grade obtuse marginal branch vein graft stenosis as well as diagonal branch vein graft stenosis. The cath was done left radially. Dr. Herbie Baltimore intervene on the circumflex obtuse marginal branch vein graft with a bare-metal stent and the following day I stented the diagonal branch vein graft with a bare-metal stent. He has done well since without chest pain.

## 2013-05-14 NOTE — Assessment & Plan Note (Signed)
Controlled on current medications 

## 2013-05-14 NOTE — Assessment & Plan Note (Signed)
Recent carotid Dopplers showed moderate bilateral internal carotid stenosis unchanged from the prior Doppler. We will recheck this in 6 months.

## 2013-05-14 NOTE — Progress Notes (Signed)
05/14/2013 Austin Herman   [birth year]  [id number]  Primary Physician Austin Fillers, MD Primary Cardiologist: Austin Gess MD Austin Herman   HPI:  The patient is a very pleasant 72 year old, mild to moderately overweight, married Caucasian male, father of 2, grandfather to 4 grandchildren whose wife unfortunately is wheelchair bound with multiple sclerosis which has been progressively worsening. The patient is the primary caregiver and therefore is under a lot of stress at home. He has a history of CAD status post coronary artery bypass grafting in 1994. His other problems include hypertension and hyperlipidemia. He denied chest pain or shortness of breath. I cath'd him June 26, 2008, revealing a patent LIMA to his LAD, a patent vein to a diagonal branch and a patent sequential vein to OM1 and 2 with an 80% stenosis just beyond the distal insertion and a 70% mid nondominant RCA stenosis with normal LV function. 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 His last Myoview performed July 24, 2010, was nonischemic. Recent lab work performed by Dr. Eloise Herman revealed total cholesterol 193, LDL of 103 and HDL of 32., moderately increased compared to his prior readings.  He was admitted to the hospital on 04/25/13 with unstable angina and underwent cardiac catheterization by Austin Herman revealing high-grade stenosis in the circumflex obtuse marginal branch vein graft as well as in the diagonal branch the graft. He ruled out for myocardial infarction. Austin Herman stented his circumflex vein graft with a bare-metal stent to the left radial approach and the following day I stented the diagonal branch vein graft with a bare-metal stent. He was discharged the following day. He's had no recurrent symptoms.  Current Outpatient  Prescriptions  Medication Sig Dispense Refill  . aspirin 81 MG chewable tablet Chew 1 tablet (81 mg total) by mouth daily.      . cetirizine (ZYRTEC) 10 MG tablet Take 10 mg by mouth daily.      . Dapagliflozin Propanediol (FARXIGA) 10 MG TABS Take 10 mg by mouth daily.      Marland Kitchen doxazosin (CARDURA) 2 MG tablet Take 2 mg by mouth at bedtime.       . fluticasone (FLONASE) 50 MCG/ACT nasal spray Place 1 spray into the nose daily as needed for allergies.       Marland Kitchen HYDROcodone-acetaminophen (NORCO/VICODIN) 5-325 MG per tablet Take 1 tablet by mouth daily as needed for moderate pain.       . isosorbide mononitrate (IMDUR) 30 MG 24 hr tablet Take 30 mg by mouth daily.      . metFORMIN (GLUCOPHAGE-XR) 500 MG 24 hr tablet Take 500 mg by mouth 2 (two) times daily.      . metoprolol succinate (TOPROL-XL) 25 MG 24 hr tablet Take 25 mg by mouth daily.      . Multiple Vitamin (MULTIVITAMIN WITH MINERALS) TABS tablet Take 1 tablet by mouth daily.      Marland Kitchen NITROSTAT 0.4 MG SL tablet Place 0.4 mg under the tongue every 5 (five) minutes as needed for chest pain.       . Omega-3 Fatty Acids (FISH OIL) 1200 MG CAPS Take 1,200 mg by mouth daily.      . simvastatin (ZOCOR) 40 MG tablet Take 40 mg by mouth at bedtime.        . Ticagrelor (BRILINTA)  90 MG TABS tablet Take 1 tablet (90 mg total) by mouth 2 (two) times daily.  60 tablet  10  . valsartan (DIOVAN) 160 MG tablet Take 160 mg by mouth daily.       No current facility-administered medications for this visit.    Allergies  Allergen Reactions  . Lipitor [Atorvastatin]   . Codeine Rash    History   Social History  . Marital Status: Married    Spouse Name: N/A    Number of Children: N/A  . Years of Education: N/A   Occupational History  . Not on file.   Social History Main Topics  . Smoking status: Former Smoker -- 20 years    Types: Cigars  . Smokeless tobacco: Former Neurosurgeon    Types: Chew    Quit date: 04/14/2013     Comment: 04/25/2013 "chew  occasionally; maybe once/week if that"  . Alcohol Use: Yes     Comment: 04/25/2013 "haven't drank a beer in months; never had problem w/it"  . Drug Use: No  . Sexual Activity: No   Other Topics Concern  . Not on file   Social History Narrative  . No narrative on file     Review of Systems: General: negative for chills, fever, night sweats or weight changes.  Cardiovascular: negative for chest pain, dyspnea on exertion, edema, orthopnea, palpitations, paroxysmal nocturnal dyspnea or shortness of breath Dermatological: negative for rash Respiratory: negative for cough or wheezing Urologic: negative for hematuria Abdominal: negative for nausea, vomiting, diarrhea, bright red blood per rectum, melena, or hematemesis Neurologic: negative for visual changes, syncope, or dizziness All other systems reviewed and are otherwise negative except as noted above.    Blood pressure 120/52, pulse 53, height 5\' 10"  (1.778 m), weight 224 lb 3.2 oz (101.696 kg).  General appearance: alert and no distress Neck: no adenopathy, no carotid bruit, no JVD, supple, symmetrical, trachea midline and thyroid not enlarged, symmetric, no tenderness/mass/nodules Lungs: clear to auscultation bilaterally Heart: regular rate and rhythm, S1, S2 normal, no murmur, click, rub or gallop Extremities: extremities normal, atraumatic, no cyanosis or edema and right femoral arterial puncture site was well-healed  EKG sinus bradycardia at 53 without ST or T wave changes  ASSESSMENT AND PLAN:   CAD - CABG in 1994 - s/p PCI + BMS to SVG-OM and SVG-diagonal branch 04/26/13 Current hospital/10/14 was unstable angina. He underwent cardiac catheterization by Austin Herman revealing high-grade obtuse marginal branch vein graft stenosis as well as diagonal branch vein graft stenosis. The cath was done left radially. Austin Herman intervene on the circumflex obtuse marginal branch vein graft with a bare-metal stent and the  following day I stented the diagonal branch vein graft with a bare-metal stent. He has done well since without chest pain.  HTN (hypertension) Controlled on current medications  Hyperlipidemia On statin therapy with recent lipid profile performed in November revealed total cholesterol 193, LDL of 103 and HDL of 32. Triglyceride level was 288.  PAD (peripheral artery disease) Recent carotid Dopplers showed moderate bilateral internal carotid stenosis unchanged from the prior Doppler. We will recheck this in 6 months.      Austin Gess MD FACP,FACC,FAHA, [facility] 05/14/2013 9:53 AM

## 2013-05-14 NOTE — Assessment & Plan Note (Signed)
On statin therapy with recent lipid profile performed in November revealed total cholesterol 193, LDL of 103 and HDL of 32. Triglyceride level was 288.

## 2013-05-14 NOTE — Patient Instructions (Signed)
The order for your stress test was cancelled.  Yo no longer need that test.  Your physician wants you to follow-up in: 6 months with an extender and 1 year with Dr Allyson Sabal. You will receive a reminder letter in the mail two months in advance. If you don't receive a letter, please call our office to schedule the follow-up appointment.  Dr Allyson Sabal would like for you to have a carotid doppler in 1 year.

## 2013-05-22 ENCOUNTER — Other Ambulatory Visit (HOSPITAL_COMMUNITY): Payer: Self-pay | Admitting: Cardiology

## 2013-06-12 ENCOUNTER — Telehealth (HOSPITAL_COMMUNITY): Payer: Self-pay | Admitting: *Deleted

## 2013-06-12 NOTE — Telephone Encounter (Signed)
Pt has a recall (green sheet) for a Q12 renal doppler. He states that he recently had a full body CT which mentions his kidneys. Does he still need the doppler?

## 2013-06-12 NOTE — Telephone Encounter (Signed)
Yes, he needs the doppler.  I reviewed the CT, it doesn't give [place] the information that we need about the renal arteries

## 2013-06-13 ENCOUNTER — Other Ambulatory Visit (HOSPITAL_COMMUNITY): Payer: Self-pay | Admitting: Cardiovascular Disease

## 2013-06-13 DIAGNOSIS — I701 Atherosclerosis of renal artery: Secondary | ICD-10-CM

## 2013-06-18 ENCOUNTER — Ambulatory Visit (HOSPITAL_COMMUNITY)
Admission: RE | Admit: 2013-06-18 | Discharge: 2013-06-18 | Disposition: A | Discharge status: Home / Self Care | Payer: Medicare Other | Source: Ambulatory Visit | Attending: Cardiovascular Disease | Admitting: Cardiovascular Disease

## 2013-06-18 DIAGNOSIS — I10 Essential (primary) hypertension: Secondary | ICD-10-CM | POA: Insufficient documentation

## 2013-06-18 DIAGNOSIS — R9389 Abnormal findings on diagnostic imaging of other specified body structures: Secondary | ICD-10-CM | POA: Insufficient documentation

## 2013-06-18 DIAGNOSIS — I701 Atherosclerosis of renal artery: Secondary | ICD-10-CM

## 2013-06-18 NOTE — Progress Notes (Signed)
Renal Duplex Completed. Niah Heinle, BS, RDMS, RVT  

## 2013-06-20 ENCOUNTER — Telehealth: Payer: Self-pay | Admitting: *Deleted

## 2013-06-20 DIAGNOSIS — I701 Atherosclerosis of renal artery: Secondary | ICD-10-CM

## 2013-06-20 NOTE — Telephone Encounter (Signed)
Message copied by Chauncy Lean on Wed Jun 20, 2013  4:08 PM ------      Message from: Lorretta Harp      Created: Wed Jun 20, 2013  3:33 PM       No change from prior study. Repeat in 12 months. ------

## 2013-06-20 NOTE — Telephone Encounter (Signed)
Order placed for repeat renal dopplers in 1 year  

## 2013-07-05 ENCOUNTER — Telehealth: Payer: Self-pay | Admitting: Oncology

## 2013-07-05 ENCOUNTER — Encounter: Payer: Self-pay | Admitting: Oncology

## 2013-07-05 ENCOUNTER — Other Ambulatory Visit (HOSPITAL_BASED_OUTPATIENT_CLINIC_OR_DEPARTMENT_OTHER): Payer: 59

## 2013-07-05 ENCOUNTER — Ambulatory Visit (HOSPITAL_BASED_OUTPATIENT_CLINIC_OR_DEPARTMENT_OTHER): Payer: 59 | Admitting: Oncology

## 2013-07-05 VITALS — BP 123/58 | HR 60 | Temp 97.4°F | Resp 18 | Ht 70.0 in | Wt 226.3 lb

## 2013-07-05 DIAGNOSIS — R161 Splenomegaly, not elsewhere classified: Secondary | ICD-10-CM

## 2013-07-05 DIAGNOSIS — D72820 Lymphocytosis (symptomatic): Secondary | ICD-10-CM

## 2013-07-05 LAB — COMPREHENSIVE METABOLIC PANEL (CC13)
ALK PHOS: 78 U/L (ref 40–150)
ALT: 28 U/L (ref 0–55)
AST: 22 U/L (ref 5–34)
Albumin: 4 g/dL (ref 3.5–5.0)
Anion Gap: 10 mEq/L (ref 3–11)
BUN: 27.6 mg/dL — AB (ref 7.0–26.0)
CO2: 24 mEq/L (ref 22–29)
CREATININE: 1.2 mg/dL (ref 0.7–1.3)
Calcium: 9.9 mg/dL (ref 8.4–10.4)
Chloride: 107 mEq/L (ref 98–109)
Glucose: 142 mg/dl — ABNORMAL HIGH (ref 70–140)
Potassium: 4.9 mEq/L (ref 3.5–5.1)
Sodium: 141 mEq/L (ref 136–145)
Total Bilirubin: 0.79 mg/dL (ref 0.20–1.20)
Total Protein: 7.2 g/dL (ref 6.4–8.3)

## 2013-07-05 LAB — TECHNOLOGIST REVIEW

## 2013-07-05 LAB — CBC WITH DIFFERENTIAL/PLATELET
BASO%: 0.2 % (ref 0.0–2.0)
BASOS ABS: 0 10*3/uL (ref 0.0–0.1)
EOS%: 2.4 % (ref 0.0–7.0)
Eosinophils Absolute: 0.3 10*3/uL (ref 0.0–0.5)
HEMATOCRIT: 44 % (ref 38.4–49.9)
HEMOGLOBIN: 14.3 g/dL (ref 13.0–17.1)
LYMPH%: 70.7 % — ABNORMAL HIGH (ref 14.0–49.0)
MCH: 28.8 pg (ref 27.2–33.4)
MCHC: 32.5 g/dL (ref 32.0–36.0)
MCV: 88.5 fL (ref 79.3–98.0)
MONO#: 0.5 10*3/uL (ref 0.1–0.9)
MONO%: 3.9 % (ref 0.0–14.0)
NEUT#: 3 10*3/uL (ref 1.5–6.5)
NEUT%: 22.8 % — AB (ref 39.0–75.0)
Platelets: 136 10*3/uL — ABNORMAL LOW (ref 140–400)
RBC: 4.97 10*6/uL (ref 4.20–5.82)
RDW: 15.2 % — AB (ref 11.0–14.6)
WBC: 13 10*3/uL — ABNORMAL HIGH (ref 4.0–10.3)
lymph#: 9.2 10*3/uL — ABNORMAL HIGH (ref 0.9–3.3)

## 2013-07-05 NOTE — Telephone Encounter (Signed)
s.w. pt and advised on Aug appt....pt ok and aware °

## 2013-07-05 NOTE — Progress Notes (Signed)
Hematology and Oncology Follow Up Visit  Austin Herman [id number] 1941-02-02 73 y.o. 07/05/2013 9:01 AM Donnajean Lopes, MDPaterson, [name], MD   Principle Diagnosis: 73 year old gentleman with a lymphocytosis diagnosed in November of 2014. 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   Prior Therapy: He is status post flow cytometry and CT scan which did not show any diffuse lymphadenopathy but slightly enlarged spleen and suggestion of a lymphoproliferative disorder.  Current therapy: Observation and surveillance.  Interim History:  Mr. Austin Herman presents today for a followup visit. He is a very nice man I saw back in November of 2014 for lymphocytosis. Since his last visit, he had some cardiac stents placed but otherwise reports no issues. He is not reporting any fevers or chills or sweats. Does not report any constitutional symptoms or any lymphadenopathy. He has not reported any recurrent infections has not reported any genitourinary complaints. He continued to perform activities of daily living without any hindrance or decline.  Medications: I have reviewed the patient's current medications.  Current Outpatient Prescriptions  Medication Sig Dispense Refill  . aspirin 81 MG chewable tablet Chew 1 tablet (81 mg total) by mouth daily.      . cetirizine (ZYRTEC) 10 MG tablet Take 10 mg by mouth daily.      . Dapagliflozin Propanediol (FARXIGA) 10 MG TABS Take 10 mg by mouth daily.      Marland Kitchen doxazosin (CARDURA) 2 MG tablet Take 2 mg by mouth at bedtime.       . fluticasone (FLONASE) 50 MCG/ACT nasal spray Place 1 spray into the nose daily as needed for allergies.       Marland Kitchen HYDROcodone-acetaminophen (NORCO/VICODIN) 5-325 MG per tablet Take 1 tablet by mouth daily as needed for moderate pain.       . isosorbide mononitrate (IMDUR) 30 MG 24 hr tablet Take 30 mg by mouth daily.      . metFORMIN (GLUCOPHAGE-XR) 500 MG 24 hr tablet Take 500 mg by  mouth 2 (two) times daily.      . metoprolol succinate (TOPROL-XL) 25 MG 24 hr tablet Take 25 mg by mouth daily.      . Multiple Vitamin (MULTIVITAMIN WITH MINERALS) TABS tablet Take 1 tablet by mouth daily.      Marland Kitchen NITROSTAT 0.4 MG SL tablet Place 0.4 mg under the tongue every 5 (five) minutes as needed for chest pain.       . Omega-3 Fatty Acids (FISH OIL) 1200 MG CAPS Take 1,200 mg by mouth daily.      . simvastatin (ZOCOR) 40 MG tablet Take 40 mg by mouth at bedtime.        . Ticagrelor (BRILINTA) 90 MG TABS tablet Take 1 tablet (90 mg total) by mouth 2 (two) times daily.  60 tablet  10  . valsartan (DIOVAN) 160 MG tablet Take 160 mg by mouth daily.       No current facility-administered medications for this visit.     Allergies:  Allergies  Allergen Reactions  . Lipitor [Atorvastatin]   . Codeine Rash    Past Medical History, Surgical history, Social history, and Family History were reviewed and updated.  Review of Systems: Constitutional:  Negative for fever, chills, night sweats, anorexia, weight loss, pain. Cardiovascular: no chest pain or dyspnea on exertion Respiratory: no cough, shortness of breath, or wheezing Neurological: negative for - confusion, dizziness or gait disturbance Dermatological: negative for pruritus and rash ENT: negative for - epistaxis,  sore throat or tinnitus Skin: Negative. Gastrointestinal: no abdominal pain, change in bowel habits, or black or bloody stools Genito-Urinary: no dysuria, trouble voiding, or hematuria Hematological and Lymphatic: negative for - bleeding problems, blood clots or bruising Breast: negative Musculoskeletal: negative for - gait disturbance, joint stiffness or muscle pain Remaining ROS negative. Physical Exam: Blood pressure 123/58, pulse 60, temperature 97.4 F (36.3 C), temperature source Oral, resp. rate 18, height 5\' 10"  (1.778 m), weight 226 lb 4.8 oz (102.649 kg). ECOG: ECOG 0 General appearance: alert,  cooperative and appears stated age Head: Normocephalic, without obvious abnormality, atraumatic Neck: no adenopathy, no carotid bruit, no JVD, supple, symmetrical, trachea midline and thyroid not enlarged, symmetric, no tenderness/mass/nodules Lymph nodes: Cervical, supraclavicular, and axillary nodes normal. Heart:regular rate and rhythm, S1, S2 normal, no murmur, click, rub or gallop Lung:chest clear, no wheezing, rales, normal symmetric air entry Abdomin: soft, non-tender, without masses or organomegaly EXT:no erythema, induration, or nodules   Lab Results: Lab Results  Component Value Date   WBC 13.0* 07/05/2013   HGB 14.3 07/05/2013   HCT 44.0 07/05/2013   MCV 88.5 07/05/2013   PLT 136* 07/05/2013     Chemistry      Component Value Date/Time   NA 142 04/27/2013 0530   NA 140 04/11/2013 1020   K 4.8 04/27/2013 0530   K 4.8 04/11/2013 1020   CL 108 04/27/2013 0530   CO2 26 04/27/2013 0530   CO2 22 04/11/2013 1020   BUN 14 04/27/2013 0530   BUN 36.3* 04/11/2013 1020   CREATININE 1.11 04/27/2013 0530   CREATININE 1.15 04/24/2013 1419   CREATININE 1.4* 04/11/2013 1020      Component Value Date/Time   CALCIUM 8.9 04/27/2013 0530   CALCIUM 9.6 04/11/2013 1020   ALKPHOS 85 04/24/2013 1419   ALKPHOS 87 04/11/2013 1020   AST 19 04/24/2013 1419   AST 23 04/11/2013 1020   ALT 30 04/24/2013 1419   ALT 32 04/11/2013 1020   BILITOT 0.8 04/24/2013 1419   BILITOT 0.61 04/11/2013 1020       Radiological Studies:  EXAM:  CT CHEST, ABDOMEN, AND PELVIS WITH CONTRAST  TECHNIQUE:  Multidetector CT imaging of the chest, abdomen and pelvis was  performed following the standard protocol during bolus  administration of intravenous contrast.  CONTRAST: 146mL OMNIPAQUE IOHEXOL 300 MG/ML SOLN  COMPARISON: None.  FINDINGS:  CT CHEST FINDINGS  Mediastinum: Heart size is normal. There is no significant  pericardial fluid, thickening or pericardial calcification. There is  atherosclerosis of  the thoracic aorta, the great vessels of the  mediastinum and the coronary arteries, including calcified  atherosclerotic plaque in the left main, left anterior descending,  left circumflex and right coronary arteries. Status post median  sternotomy for CABG, including LIMA to the LAD. No pathologically  enlarged mediastinal or hilar lymph nodes. Esophagus is unremarkable  in appearance.  Lungs/Pleura: 7 mm nodule in the right lower lobe adjacent to the  major fissure (image 38 of series 5). No other larger more  suspicious appearing pulmonary nodules or masses are otherwise  noted. No acute consolidative airspace disease. No pleural  effusions.  Musculoskeletal: No significant axillary lymphadenopathy. Median  sternotomy wires. There are no aggressive appearing lytic or blastic  lesions noted in the visualized portions of the skeleton.  CT ABDOMEN AND PELVIS FINDINGS  Abdomen/Pelvis: Mild left renal atrophy. Sub cm low-attenuation  lesion in the lower pole of the left kidney is too small to  definitively characterize.  3.2 cm simple cyst extending  exophytically from the medial aspect of the upper pole left kidney.  Sub cm low-attenuation lesion adjacent to the falciform ligament in  the left lobe of the liver is incompletely characterized on today's  examination, but is favored to represent a tiny cyst. No other  suspicious hepatic lesions are noted. The appearance the  gallbladder, pancreas and bilateral adrenal glands is unremarkable.  The spleen is mildly enlarged measuring 14.1 x 8.3 x 13.1 cm  (estimated splenic volume of 767 mL).  Extensive atherosclerosis throughout the abdominal and pelvic  vasculature, without evidence of aneurysm or dissection. No  significant volume of ascites. No pneumoperitoneum. No pathologic  distention of small bowel. No definite lymphadenopathy identified  within the abdomen or pelvis. Prostate gland and bladder are  unremarkable in appearance.   Musculoskeletal: There are no aggressive appearing lytic or blastic  lesions noted in the visualized portions of the skeleton.  IMPRESSION:  1. The only finding in the chest, abdomen or pelvis that is  concerning for potential lymphoproliferative disorder is the  presence of splenomegaly (estimated splenic volume of 767 mL). No  significant lymphadenopathy is identified.  2. No suspicious findings are otherwise noted to account for the  patient's elevated white blood cell count.  3. Atherosclerosis, including left main and 3 vessel coronary artery  disease. Status post median sternotomy for CABG, including LIMA to  the LAD.  4. Additional incidental findings, as above.   Impression and Plan:  73 year old gentleman with the following issues:  1. Lymphocytosis as a sign of a possible lymphoproliferative disorder. The differential diagnosis again includes CLL versus mantle cell lymphoma. His CT scan and laboratory data were discussed today and showed slightly enlarged spleen. Regardless of the etiology of his lymphocytosis he is asymptomatic at this time and I do not see any indication for treatment. We'll continue with observation and surveillance and will intervene at that time.  2. Splenomegaly: This is most likely related to his lymphoproliferative disorder and he continues to be asymptomatic at this point.  3. Followup will be in 3 months time.  [facility], MD 2/19/20159:01 AM

## 2013-10-15 ENCOUNTER — Encounter: Payer: Self-pay | Admitting: Cardiology

## 2013-10-15 ENCOUNTER — Ambulatory Visit (INDEPENDENT_AMBULATORY_CARE_PROVIDER_SITE_OTHER): Payer: Medicare Other | Admitting: Cardiology

## 2013-10-15 VITALS — BP 138/78 | HR 50 | Ht 70.5 in | Wt 215.1 lb

## 2013-10-15 DIAGNOSIS — R001 Bradycardia, unspecified: Secondary | ICD-10-CM

## 2013-10-15 DIAGNOSIS — Z79899 Other long term (current) drug therapy: Secondary | ICD-10-CM

## 2013-10-15 DIAGNOSIS — E782 Mixed hyperlipidemia: Secondary | ICD-10-CM

## 2013-10-15 DIAGNOSIS — E785 Hyperlipidemia, unspecified: Secondary | ICD-10-CM

## 2013-10-15 DIAGNOSIS — I2510 Atherosclerotic heart disease of native coronary artery without angina pectoris: Secondary | ICD-10-CM

## 2013-10-15 DIAGNOSIS — I739 Peripheral vascular disease, unspecified: Secondary | ICD-10-CM

## 2013-10-15 DIAGNOSIS — I10 Essential (primary) hypertension: Secondary | ICD-10-CM

## 2013-10-15 DIAGNOSIS — I498 Other specified cardiac arrhythmias: Secondary | ICD-10-CM

## 2013-10-15 HISTORY — DX: Bradycardia, unspecified: R00.1

## 2013-10-15 NOTE — Assessment & Plan Note (Signed)
75% lt renal artery stenosis, followed by dopplersand mild carotid stenosis, mild bilateral No change on the Doppler done in February.

## 2013-10-15 NOTE — Patient Instructions (Addendum)
Austin Herman has ordered you to have some blood work done - FASTING.  Austin Herman recommends that you schedule a follow-up appointment in 6 months with Dr Gwenlyn Found

## 2013-10-15 NOTE — Progress Notes (Signed)
10/15/2013   PCP: Donnajean Lopes, MD   No chief complaint on file.   Primary Cardiologist:Dr. Adora Fridge   HPI:  73 year old, mild to moderately overweight, married Caucasian male, father of 2, grandfather to 4 grandchildren whose wife unfortunately is wheelchair bound with multiple sclerosis which has been progressively worsening. The patient is the primary caregiver and therefore is under a lot of stress at home. He has a history of CAD status post coronary artery bypass grafting in 1994. His other problems include hypertension and hyperlipidemia. He denied chest pain or shortness of breath. He was cath'd him June 26, 2008, revealing a patent LIMA to his LAD, a patent vein to a diagonal branch and a patent sequential vein to OM1 and 2 with an 80% stenosis just beyond the distal insertion and a 70% mid nondominant RCA stenosis with normal LV function. 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 His last Myoview performed July 24, 2010, was nonischemic. Recent lab work performed by Dr. Philip Aspen revealed total cholesterol 193, LDL of 103 and HDL of 32., moderately increased compared to his prior readings.   He was admitted to the hospital on 04/25/13 with unstable angina and underwent cardiac catheterization by Dr. Glenetta Hew revealing high-grade stenosis in the circumflex obtuse marginal branch vein graft as well as in the diagonal branch the graft. He ruled out for myocardial infarction. Dr. Ellyn Hack stented his circumflex vein graft with a bare-metal stent to the left radial approach and the following day Dr. Gwenlyn Found stented the diagonal branch vein graft with a bare-metal stent.   He has abnormal WBC and is being followed for CLL.  No chest pain or SOB, He does have bradycardia with a heart rate of 50 today. Last EKG  heart rate was 53 he has mild dizziness when he goes from sitting to standing on only on occasion.  We discussed importance of letting us know if he becomes more and more weak or episodes of lightheadedness or near syncope we would decrease his metoprolol at a point.  Allergies  Allergen Reactions  . Lipitor [Atorvastatin]   . Codeine Rash    Current Outpatient Prescriptions  Medication Sig Dispense Refill  . aspirin EC 81 MG tablet Take 81 mg by mouth daily.      . cetirizine (ZYRTEC) 10 MG tablet Take 10 mg by mouth daily.      . Dapagliflozin Propanediol (FARXIGA) 10 MG TABS Take 10 mg by mouth daily.      Marland Kitchen doxazosin (CARDURA) 2 MG tablet Take 2 mg by mouth at bedtime.       . fluticasone (FLONASE) 50 MCG/ACT nasal spray Place 1 spray into the nose daily as needed for allergies.       Marland Kitchen HYDROcodone-acetaminophen (NORCO/VICODIN) 5-325 MG per tablet Take 1 tablet by mouth daily as needed for moderate pain.       . isosorbide mononitrate (IMDUR) 30 MG 24 hr tablet Take 30 mg by mouth daily.      . metFORMIN (GLUCOPHAGE-XR) 500 MG 24 hr tablet Take 500 mg by mouth 2 (two) times daily.      . metoprolol succinate (TOPROL-XL) 25 MG 24 hr tablet Take 25 mg by mouth daily.      . Multiple Vitamin (MULTIVITAMIN WITH MINERALS) TABS tablet  Take 1 tablet by mouth daily.      Marland Kitchen NITROSTAT 0.4 MG SL tablet Place 0.4 mg under the tongue every 5 (five) minutes as needed for chest pain.       . Omega-3 Fatty Acids (FISH OIL) 1200 MG CAPS Take 1,200 mg by mouth daily.      . simvastatin (ZOCOR) 40 MG tablet Take 40 mg by mouth at bedtime.        . Ticagrelor (BRILINTA) 90 MG TABS tablet Take 1 tablet (90 mg total) by mouth 2 (two) times daily.  60 tablet  10  . valsartan (DIOVAN) 160 MG tablet Take 160 mg by mouth daily.       No current facility-administered medications for this visit.    Past Medical History  Diagnosis Date  . Hyperlipidemia   . 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 CAD (coronary artery  disease)     Hx of CABG 1994, last cath 2010 with patent grafts but graft insertion stenosis, treated medically  . PAD (peripheral artery disease)     75% lt renal artery stenosis, followed by dopplersand mild carotid stenosis, mild bilaateral  . HTN (hypertension)   . Tobacco chew use 10/17/2012  . PONV (postoperative nausea and vomiting) 1994    "after bypass" (04/25/2013)  . OSA (obstructive sleep apnea)     Sleep Study - 03/30/2009 - AHI-40.6/hr, AHI during REM-36.9/hr, RDI-52.4, RDI during REM-36.9/hr, and average O2 sat during REM and NREM-90%.  . Type II diabetes mellitus   . GERD (gastroesophageal reflux disease)   . Arthritis   . Chronic back pain     "deterioration between C2-C7 w/?circulation/pain; herniated discs & deterioration in lower back" (04/25/2013)  . Nocturia     "get up 3-4 times/night" (04/25/2013)  . CLL (chronic lymphocytic leukemia)     "maybe; seeing Dr. Alen Blew; I'm being investigated for this now" (04/25/2013)  . Sinus bradycardia 10/15/2013    Past Surgical History  Procedure Laterality Date  . Cystectomy Left 2000    rib cage area   . Carotid doppler  03/23/2012    R Subclavian 0-49% diameter reduction, Bilat Bulb/Proximal ICAs-no significant diameter reduction, Bilat mid ICAs 50-69% diameter reduction, L Subclavian 50-69% diameter reduction.  . Cardiovascular stress test  07/24/2010    No scintigraphic evidence for inducable myocardial ischemia.ECG positive for ischemia. Exaggerated blood pressure response to exercise.   . Cardiac catheterization  1995, 2010    LIMA PATENT,  VG-diag; seq VG-OM1 & 2patent though 80% stenosis just beyond the insertion site   LAST NUC 06/2010 NEGATIVE FOR ISCHEMIA  . Cardiac catheterization  03/10/1993    Recommendation - CABG  . Cardiac catheterization  06/26/2008    No intervention. Continue medical therapy.  . Coronary angioplasty with stent placement  04/25/2013    "2 stents" (04/25/2013)  . Coronary artery bypass graft   02/1993    LIMA to LAD, vein graft to firs diag, sequential vein graft to OM1 and OM 2    OZH:YQMVHQI:ON colds or fevers,  weight is down from 224 to 215 lbs.. 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  He is  active in addition to caring for his wife and he still works and mows 9 acres of land.  PHYSICAL EXAM BP 138/78  Pulse 50  Ht 5' 10.5" (1.791 m)  Wt 215 lb 1.6 oz (97.569 kg)  BMI 30.42 kg/m2 General:Pleasant affect, NAD Skin:Warm and dry, brisk capillary refill HEENT:normocephalic, sclera clear, mucus membranes moist Neck:supple, no JVD, no bruits  Heart:S1S2 RRR without murmur, gallup, rub or click Lungs:clear without rales, rhonchi, or wheezes KVQ:QVZD, non tender, + BS, do not palpate liver spleen or masses Ext:no lower ext edema, 2+ pedal pulses, 2+ radial pulses Neuro:alert and oriented, MAE, follows commands, + facial symmetry  EKG: Sinus rhythm rate of 50 with some sinus arrhythmia  ASSESSMENT AND PLAN CAD - CABG in 1994 - s/p PCI + BMS to SVG-OM and SVG-diagonal branch 04/26/13 Hx of CABG 1994, last cath 2010 with patent grafts but graft insertion stenosis, treated medically- 12/ 2014 with new stenosis in the circumflex undergoing bare-metal stent and bare-metal stent to the diagonal branch. No chest pain or shortness of breath since the new stents.  He continues on Brilinta.  HTN (hypertension) Controlled  Sinus bradycardia Heart rate is maintaining 53-50 sinus bradycardia he is asymptomatic with this. We discussed if he begins with episodes certainly if syncope but any  lightheadedness dizziness or increasing fatigue he should notify us we would decrease his beta blocker.  PAD (peripheral artery disease) 75% lt renal artery stenosis, followed by dopplersand mild carotid stenosis, mild bilateral No change on the Doppler done in February.   Hyperlipidemia We'll check lipid panel to ensure stability

## 2013-10-15 NOTE — Assessment & Plan Note (Signed)
Heart rate is maintaining 53-50 sinus bradycardia he is asymptomatic with this. We discussed if he begins with episodes certainly if syncope but any lightheadedness dizziness or increasing fatigue he should notify us we would decrease his beta blocker.

## 2013-10-15 NOTE — Assessment & Plan Note (Signed)
Hx of CABG 1994, last cath 2010 with patent grafts but graft insertion stenosis, treated medically- 12/ 2014 with new stenosis in the circumflex undergoing bare-metal stent and bare-metal stent to the diagonal branch. No chest pain or shortness of breath since the new stents.  He continues on Brilinta.

## 2013-10-15 NOTE — Assessment & Plan Note (Signed)
We'll check lipid panel to ensure stability

## 2013-10-15 NOTE — Assessment & Plan Note (Signed)
Controlled.  

## 2013-11-26 ENCOUNTER — Encounter (INDEPENDENT_AMBULATORY_CARE_PROVIDER_SITE_OTHER): Payer: Medicare Other | Admitting: Ophthalmology

## 2013-11-26 DIAGNOSIS — H4310 Vitreous hemorrhage, unspecified eye: Secondary | ICD-10-CM

## 2013-11-26 DIAGNOSIS — E11319 Type 2 diabetes mellitus with unspecified diabetic retinopathy without macular edema: Secondary | ICD-10-CM

## 2013-11-26 DIAGNOSIS — E1165 Type 2 diabetes mellitus with hyperglycemia: Secondary | ICD-10-CM

## 2013-11-26 DIAGNOSIS — H33309 Unspecified retinal break, unspecified eye: Secondary | ICD-10-CM

## 2013-11-26 DIAGNOSIS — H43819 Vitreous degeneration, unspecified eye: Secondary | ICD-10-CM

## 2013-11-26 DIAGNOSIS — H2510 Age-related nuclear cataract, unspecified eye: Secondary | ICD-10-CM

## 2013-11-26 DIAGNOSIS — E1139 Type 2 diabetes mellitus with other diabetic ophthalmic complication: Secondary | ICD-10-CM

## 2013-11-28 ENCOUNTER — Other Ambulatory Visit (HOSPITAL_COMMUNITY): Payer: Self-pay | Admitting: Cardiovascular Disease

## 2013-12-03 ENCOUNTER — Ambulatory Visit (INDEPENDENT_AMBULATORY_CARE_PROVIDER_SITE_OTHER): Payer: Medicare Other | Admitting: Ophthalmology

## 2013-12-03 DIAGNOSIS — H33309 Unspecified retinal break, unspecified eye: Secondary | ICD-10-CM

## 2013-12-24 ENCOUNTER — Other Ambulatory Visit (HOSPITAL_COMMUNITY): Payer: Self-pay | Admitting: Cardiovascular Disease

## 2013-12-24 NOTE — Telephone Encounter (Signed)
Rx was sent to pharmacy electronically. 

## 2013-12-25 ENCOUNTER — Telehealth: Payer: Self-pay | Admitting: Cardiovascular Disease

## 2013-12-25 NOTE — Telephone Encounter (Signed)
Spoke to patient. Patient states he never had labs done from his 6/15 appointment with Mickel Baas. Request copy of labslip. RN INFORMED PATIENT WILL PLACE IN MAIL A COPY OF LABSLIP -CMP ,LIPD. Patient verbalized understanding.

## 2013-12-25 NOTE — Telephone Encounter (Signed)
Pt called in stating that he normally has labs done before he comes in to see the Dr. He needs orders put in for that. Please call  Thanks

## 2014-01-02 ENCOUNTER — Encounter: Payer: Self-pay | Admitting: Oncology

## 2014-01-02 ENCOUNTER — Telehealth: Payer: Self-pay | Admitting: Oncology

## 2014-01-02 ENCOUNTER — Other Ambulatory Visit (HOSPITAL_BASED_OUTPATIENT_CLINIC_OR_DEPARTMENT_OTHER): Payer: Medicare Other

## 2014-01-02 ENCOUNTER — Ambulatory Visit (HOSPITAL_BASED_OUTPATIENT_CLINIC_OR_DEPARTMENT_OTHER): Payer: Medicare Other | Admitting: Oncology

## 2014-01-02 VITALS — BP 105/44 | HR 56 | Temp 97.8°F | Resp 17 | Ht 70.0 in | Wt 222.3 lb

## 2014-01-02 DIAGNOSIS — D72820 Lymphocytosis (symptomatic): Secondary | ICD-10-CM

## 2014-01-02 DIAGNOSIS — R161 Splenomegaly, not elsewhere classified: Secondary | ICD-10-CM

## 2014-01-02 DIAGNOSIS — C9110 Chronic lymphocytic leukemia of B-cell type not having achieved remission: Secondary | ICD-10-CM

## 2014-01-02 LAB — TECHNOLOGIST REVIEW

## 2014-01-02 LAB — CBC WITH DIFFERENTIAL/PLATELET
BASO%: 0.7 % (ref 0.0–2.0)
BASOS ABS: 0.1 10*3/uL (ref 0.0–0.1)
EOS ABS: 0.4 10*3/uL (ref 0.0–0.5)
EOS%: 2.2 % (ref 0.0–7.0)
HCT: 42 % (ref 38.4–49.9)
HGB: 13.6 g/dL (ref 13.0–17.1)
LYMPH%: 73.2 % — ABNORMAL HIGH (ref 14.0–49.0)
MCH: 28.7 pg (ref 27.2–33.4)
MCHC: 32.4 g/dL (ref 32.0–36.0)
MCV: 88.4 fL (ref 79.3–98.0)
MONO#: 0.7 10*3/uL (ref 0.1–0.9)
MONO%: 3.7 % (ref 0.0–14.0)
NEUT%: 20.2 % — ABNORMAL LOW (ref 39.0–75.0)
NEUTROS ABS: 3.9 10*3/uL (ref 1.5–6.5)
PLATELETS: 140 10*3/uL (ref 140–400)
RBC: 4.75 10*6/uL (ref 4.20–5.82)
RDW: 14.9 % — ABNORMAL HIGH (ref 11.0–14.6)
WBC: 19.3 10*3/uL — ABNORMAL HIGH (ref 4.0–10.3)
lymph#: 14.2 10*3/uL — ABNORMAL HIGH (ref 0.9–3.3)

## 2014-01-02 LAB — COMPREHENSIVE METABOLIC PANEL (CC13)
ALK PHOS: 79 U/L (ref 40–150)
ALT: 23 U/L (ref 0–55)
AST: 20 U/L (ref 5–34)
Albumin: 3.6 g/dL (ref 3.5–5.0)
Anion Gap: 9 mEq/L (ref 3–11)
BUN: 24.1 mg/dL (ref 7.0–26.0)
CALCIUM: 8.9 mg/dL (ref 8.4–10.4)
CO2: 20 mEq/L — ABNORMAL LOW (ref 22–29)
Chloride: 112 mEq/L — ABNORMAL HIGH (ref 98–109)
Creatinine: 1.5 mg/dL — ABNORMAL HIGH (ref 0.7–1.3)
Glucose: 185 mg/dl — ABNORMAL HIGH (ref 70–140)
Potassium: 4.2 mEq/L (ref 3.5–5.1)
Sodium: 141 mEq/L (ref 136–145)
Total Bilirubin: 0.71 mg/dL (ref 0.20–1.20)
Total Protein: 6.7 g/dL (ref 6.4–8.3)

## 2014-01-02 NOTE — Telephone Encounter (Signed)
Pt confirmed labs/ov per 08/19 POF, gave pt AVS....KJ °

## 2014-01-02 NOTE — Progress Notes (Signed)
Hematology and Oncology Follow Up Visit  Austin Herman [id number] 1941/01/16 73 y.o. 01/02/2014 10:12 AM Donnajean Lopes, MDPaterson, [name], MD   Principle Diagnosis: 73 year old gentleman with a lymphocytosis diagnosed in November of 2014. 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   Prior Therapy: He is status post flow cytometry and CT scan which did not show any diffuse lymphadenopathy but slightly enlarged spleen and suggestion of a lymphoproliferative disorder.  Current therapy: Observation and surveillance.  Interim History:  Austin Herman presents today for a followup visit. Since his last visit, he reports no new complaints. He is not reporting any fevers or chills or sweats. Does not report any constitutional symptoms or any lymphadenopathy. He has not reported any recurrent infections has not reported any genitourinary complaints. He continued to perform activities of daily living without any hindrance or decline. He is not reporting any abdominal fullness or early satiety. There is no pain nausea or vomiting or change in his bowel habits. He did not report any chest pain or palpitation. Does note shortness of breath or wheezing. He does not report any cough or hemoptysis. He does not report any frequency urgency or urinary symptoms. He does not have any petechiae or rashes. Does not report any bleeding or clotting tendencies. Rest of his review of systems unremarkable  Medications: I have reviewed the patient's current medications.  Current Outpatient Prescriptions  Medication Sig Dispense Refill  . aspirin EC 81 MG tablet Take 81 mg by mouth daily.      . cetirizine (ZYRTEC) 10 MG tablet Take 10 mg by mouth daily.      . CYANOCOBALAMIN PO Take 1,200 mg by mouth daily.      . Dapagliflozin Propanediol (FARXIGA) 10 MG TABS Take 10 mg by mouth daily.      Marland Kitchen DIOVAN 160 MG tablet TAKE 1 TABLET BY MOUTH EVERY DAY  30 tablet  9   . doxazosin (CARDURA) 2 MG tablet Take 2 mg by mouth at bedtime.       . fluticasone (FLONASE) 50 MCG/ACT nasal spray Place 1 spray into the nose daily as needed for allergies.       Marland Kitchen HYDROcodone-acetaminophen (NORCO/VICODIN) 5-325 MG per tablet Take 1 tablet by mouth daily as needed for moderate pain.       . isosorbide mononitrate (IMDUR) 30 MG 24 hr tablet Take 30 mg by mouth daily.      . metFORMIN (GLUCOPHAGE-XR) 500 MG 24 hr tablet Take 500 mg by mouth 2 (two) times daily.      . metoprolol succinate (TOPROL-XL) 25 MG 24 hr tablet Take 25 mg by mouth daily.      . metoprolol succinate (TOPROL-XL) 25 MG 24 hr tablet TAKE 1 TABLET BY MOUTH EVERY DAY  30 tablet  9  . Multiple Vitamin (MULTIVITAMIN WITH MINERALS) TABS tablet Take 1 tablet by mouth daily.      Marland Kitchen NITROSTAT 0.4 MG SL tablet Place 0.4 mg under the tongue every 5 (five) minutes as needed for chest pain.       . Omega-3 Fatty Acids (FISH OIL) 1200 MG CAPS Take 1,200 mg by mouth daily.      . simvastatin (ZOCOR) 40 MG tablet Take 40 mg by mouth at bedtime.        . Ticagrelor (BRILINTA) 90 MG TABS tablet Take 1 tablet (90 mg total) by mouth 2 (two) times daily.  60 tablet  10   No current  facility-administered medications for this visit.     Allergies:  Allergies  Allergen Reactions  . Lipitor [Atorvastatin]   . Codeine Rash    Past Medical History, Surgical history, Social history, and Family History were reviewed and updated.   Physical Exam: Blood pressure 105/44, pulse 56, temperature 97.8 F (36.6 C), temperature source Oral, resp. rate 17, height 5\' 10"  (1.778 m), weight 222 lb 4.8 oz (100.835 kg), SpO2 97.00%. ECOG: ECOG 0 General appearance: alert and cooperative Head: Normocephalic, without obvious abnormality, atraumatic Neck: no adenopathy Lymph nodes: Cervical, supraclavicular, and axillary nodes normal. Heart:regular rate and rhythm, S1, S2 normal, no murmur, click, rub or gallop Lung:chest clear, no  wheezing, rales, normal symmetric air entry Abdomin: soft, non-tender, without masses or organomegaly EXT:no erythema, induration, or nodules   Lab Results: Lab Results  Component Value Date   WBC 19.3* 01/02/2014   HGB 13.6 01/02/2014   HCT 42.0 01/02/2014   MCV 88.4 01/02/2014   PLT 140 01/02/2014     Chemistry      Component Value Date/Time   NA 141 01/02/2014 0915   NA 142 04/27/2013 0530   K 4.2 01/02/2014 0915   K 4.8 04/27/2013 0530   CL 108 04/27/2013 0530   CO2 20* 01/02/2014 0915   CO2 26 04/27/2013 0530   BUN 24.1 01/02/2014 0915   BUN 14 04/27/2013 0530   CREATININE 1.5* 01/02/2014 0915   CREATININE 1.11 04/27/2013 0530   CREATININE 1.15 04/24/2013 1419      Component Value Date/Time   CALCIUM 8.9 01/02/2014 0915   CALCIUM 8.9 04/27/2013 0530   ALKPHOS 79 01/02/2014 0915   ALKPHOS 85 04/24/2013 1419   AST 20 01/02/2014 0915   AST 19 04/24/2013 1419   ALT 23 01/02/2014 0915   ALT 30 04/24/2013 1419   BILITOT 0.71 01/02/2014 0915   BILITOT 0.8 04/24/2013 1419        Impression and Plan:  73 year old gentleman with the following issues:  1. 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 The differential diagnosis again includes CLL versus mantle cell lymphoma. His CT scan and laboratory data showed slightly enlarged spleen. Regardless of the etiology of his lymphocytosis he is asymptomatic at this time and I do not see any indication for treatment. We will continue with observation and surveillance. She develops a rapid rise in his white cell count, painful lymphadenopathy, painful splenomegaly or any constitutional symptoms we will consider treatment at that time. Treatment will be in the form of systemic chemotherapy which I see no need for at this time.  2. Splenomegaly: This is most likely related to his lymphoproliferative disorder and he continues to be asymptomatic at this point.  3. Followup will be in 4 months time.  [facility], MD 8/19/201510:12  AM

## 2014-01-03 ENCOUNTER — Telehealth: Payer: Self-pay | Admitting: Cardiovascular Disease

## 2014-01-03 LAB — COMPREHENSIVE METABOLIC PANEL
ALT: 26 U/L (ref 0–53)
AST: 21 U/L (ref 0–37)
Albumin: 4 g/dL (ref 3.5–5.2)
Alkaline Phosphatase: 71 U/L (ref 39–117)
BILIRUBIN TOTAL: 0.7 mg/dL (ref 0.2–1.2)
BUN: 22 mg/dL (ref 6–23)
CO2: 20 meq/L (ref 19–32)
CREATININE: 1.33 mg/dL (ref 0.50–1.35)
Calcium: 8.8 mg/dL (ref 8.4–10.5)
Chloride: 107 mEq/L (ref 96–112)
GLUCOSE: 151 mg/dL — AB (ref 70–99)
Potassium: 4.5 mEq/L (ref 3.5–5.3)
Sodium: 139 mEq/L (ref 135–145)
Total Protein: 6.9 g/dL (ref 6.0–8.3)

## 2014-01-03 LAB — LIPID PANEL
CHOL/HDL RATIO: 5.5 ratio
CHOLESTEROL: 144 mg/dL (ref 0–200)
HDL: 26 mg/dL — ABNORMAL LOW (ref >39)
TRIGLYCERIDES: 505 mg/dL — AB (ref <150)

## 2014-01-03 LAB — LDL CHOLESTEROL, DIRECT: LDL DIRECT: 60 mg/dL

## 2014-01-03 NOTE — Telephone Encounter (Signed)
Returning a call,does not know who called him.

## 2014-01-04 NOTE — Telephone Encounter (Signed)
Spoke with pt regarding labs. He is interested in going to the lipid clinic but will call back to schedule appt.

## 2014-03-19 ENCOUNTER — Ambulatory Visit (INDEPENDENT_AMBULATORY_CARE_PROVIDER_SITE_OTHER): Payer: Medicare Other | Admitting: Cardiovascular Disease

## 2014-03-19 ENCOUNTER — Encounter: Payer: Self-pay | Admitting: Cardiovascular Disease

## 2014-03-19 VITALS — BP 152/80 | HR 65 | Ht 70.0 in | Wt 225.0 lb

## 2014-03-19 DIAGNOSIS — I2510 Atherosclerotic heart disease of native coronary artery without angina pectoris: Secondary | ICD-10-CM

## 2014-03-19 DIAGNOSIS — E785 Hyperlipidemia, unspecified: Secondary | ICD-10-CM

## 2014-03-19 DIAGNOSIS — I739 Peripheral vascular disease, unspecified: Secondary | ICD-10-CM

## 2014-03-19 DIAGNOSIS — I10 Essential (primary) hypertension: Secondary | ICD-10-CM

## 2014-03-19 NOTE — Assessment & Plan Note (Signed)
Controlled on current medications 

## 2014-03-19 NOTE — Patient Instructions (Signed)
Dr Berry recommends that you schedule a follow-up appointment in 1 year. You will receive a reminder letter in the mail two months in advance. If you don't receive a letter, please call our office to schedule the follow-up appointment. 

## 2014-03-19 NOTE — Assessment & Plan Note (Signed)
History of CAD status post coronary artery bypass grafting in 1994. I performed cardiac catheterization on him 06/26/08 revealing a patent LIMA to the LAD, patent vein to diagonal branch, obtuse marginal branches 1 and 2 the 80% stenosis just beyond the distal insertion and 70% mid nondominant RCA stenosis with normal LV function. He was admitted with unstable angina 04/25/13 and underwent cardiac catheterization by Dr. Ellyn Hack revealing disease within his diagonal branch and obtuse marginal branch vein grafts. Dr. Ellyn Hack stented his circumflex vein graft with a bare-metal stent and I stented his diagonal branch vein graft with a bare metal stent the following day. He has been asymptomatic since.

## 2014-03-19 NOTE — Assessment & Plan Note (Signed)
History of 75% left renal artery stenosis as well as moderate bilateral internal carotid artery stenosis all of which are followed by duplex ultrasound and have remained stable

## 2014-03-19 NOTE — Assessment & Plan Note (Signed)
On statin therapy as well as fish oil with a total cholesterol measured 01/02/14 of 144 and a high triglyceride level of 505 which he attributes to binge eating of ice cream.

## 2014-03-19 NOTE — Progress Notes (Signed)
03/19/2014 Austin Herman   [birth year]  [id number]  Primary Physician Donnajean Lopes, MD Primary Cardiologist: Lorretta Harp MD Renae Gloss   HPI:  The patient is a very pleasant 73 year old, mild to moderately overweight, married Caucasian male, father of 108, grandfather to 4 grandchildren whose wife unfortunately is wheelchair bound with multiple sclerosis which has been progressively worsening.I last saw him 05/14/13 and he saw Molli Hazard registered nurse practitioner 10/15/13. The patient is the primary caregiver and therefore is under a lot of stress at home. He has a history of CAD status post coronary artery bypass grafting in 1994. His other problems include hypertension and hyperlipidemia. He denied chest pain or shortness of breath. I cath'd him June 26, 2008, revealing a patent LIMA to his LAD, a patent vein to a diagonal branch and a patent sequential vein to OM1 and 2 with an 80% stenosis just beyond the distal insertion and a 70% mid nondominant RCA stenosis with normal LV function. 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 His last Myoview performed July 24, 2010, was nonischemic. Recent lab work performed by Dr. Philip Aspen revealed total cholesterol 193, LDL of 103 and HDL of 32., moderately increased compared to his prior readings.  He was admitted to the hospital on 04/25/13 with unstable angina and underwent cardiac catheterization by Dr. Glenetta Hew revealing high-grade stenosis in the circumflex obtuse marginal branch vein graft as well as in the diagonal branch the graft. He ruled out for myocardial infarction. Dr. Ellyn Hack stented his circumflex vein graft with a bare-metal stent to the left radial approach and the following day I stented the diagonal branch vein graft with a bare-metal stent. He was  discharged the following day. Since he was seen back 10 months ago he denies chest pain or shortness of breath. We have been following his renal and carotid Dopplers all of which have remained stable.   Current Outpatient Prescriptions  Medication Sig Dispense Refill  . aspirin EC 81 MG tablet Take 81 mg by mouth daily.    . cetirizine (ZYRTEC) 10 MG tablet Take 10 mg by mouth daily.    . CYANOCOBALAMIN PO Take 1,200 mg by mouth daily.    . Dapagliflozin Propanediol (FARXIGA) 10 MG TABS Take 10 mg by mouth daily.    Marland Kitchen DIOVAN 160 MG tablet TAKE 1 TABLET BY MOUTH EVERY DAY 30 tablet 9  . doxazosin (CARDURA) 2 MG tablet Take 2 mg by mouth at bedtime.     . fluticasone (FLONASE) 50 MCG/ACT nasal spray Place 1 spray into the nose daily as needed for allergies.     Marland Kitchen HYDROcodone-acetaminophen (NORCO/VICODIN) 5-325 MG per tablet Take 1 tablet by mouth daily as needed for moderate pain.     . isosorbide mononitrate (IMDUR) 30 MG 24 hr tablet Take 30 mg by mouth daily.    . metFORMIN (GLUCOPHAGE-XR) 500 MG 24 hr tablet Take 500 mg by mouth 2 (two) times daily.    . metoprolol succinate (TOPROL-XL) 25 MG 24 hr tablet TAKE 1 TABLET BY MOUTH EVERY DAY 30 tablet 9  . Multiple Vitamin (MULTIVITAMIN WITH MINERALS) TABS tablet Take 1 tablet by mouth daily.    Marland Kitchen NITROSTAT 0.4 MG SL tablet Place 0.4 mg under the tongue every 5 (five) minutes as needed for chest pain.     Marland Kitchen  Omega-3 Fatty Acids (FISH OIL) 1200 MG CAPS Take 1,200 mg by mouth daily.    . simvastatin (ZOCOR) 40 MG tablet Take 40 mg by mouth at bedtime.      . Ticagrelor (BRILINTA) 90 MG TABS tablet Take 1 tablet (90 mg total) by mouth 2 (two) times daily. 60 tablet 10  . vitamin C (ASCORBIC ACID) 500 MG tablet Take 500 mg by mouth 2 (two) times daily.     No current facility-administered medications for this visit.    Allergies  Allergen Reactions  . Lipitor [Atorvastatin]   . Codeine Rash    History   Social History  . Marital Status:  Married    Spouse Name: N/A    Number of Children: N/A  . Years of Education: N/A   Occupational History  . Not on file.   Social History Main Topics  . Smoking status: Former Smoker -- 20 years    Types: Cigars  . Smokeless tobacco: Former Systems developer    Types: [place] date: 04/14/2013     Comment: 04/25/2013 "chew occasionally; maybe once/week if that"  . Alcohol Use: Yes     Comment: 04/25/2013 "haven't drank a beer in months; never had problem w/it"  . Drug Use: No  . Sexual Activity: No   Other Topics Concern  . Not on file   Social History Narrative     Review of Systems: General: negative for chills, fever, night sweats or weight changes.  Cardiovascular: negative for chest pain, dyspnea on exertion, edema, orthopnea, palpitations, paroxysmal nocturnal dyspnea or shortness of breath Dermatological: negative for rash Respiratory: negative for cough or wheezing Urologic: negative for hematuria Abdominal: negative for nausea, vomiting, diarrhea, bright red blood per rectum, melena, or hematemesis Neurologic: negative for visual changes, syncope, or dizziness All other systems reviewed and are otherwise negative except as noted above.    Blood pressure 152/80, pulse 65, height 5\' 10"  (1.778 m), weight 225 lb (102.059 kg).  General appearance: alert and no distress Neck: no adenopathy, no carotid bruit, no JVD, supple, symmetrical, trachea midline and thyroid not enlarged, symmetric, no tenderness/mass/nodules Lungs: clear to auscultation bilaterally Heart: regular rate and rhythm, S1, S2 normal, no murmur, click, rub or gallop Extremities: extremities normal, atraumatic, no cyanosis or edema  EKG normal sinus rhythm at 65 without ST or T-wave changes  ASSESSMENT AND PLAN:   CAD - CABG in 1994 - s/p PCI + BMS to SVG-OM and SVG-diagonal branch 04/26/13 History of CAD status post coronary artery bypass grafting in 1994. I performed cardiac catheterization on him  06/26/08 revealing a patent LIMA to the LAD, patent vein to diagonal branch, obtuse marginal branches 1 and 2 the 80% stenosis just beyond the distal insertion and 70% mid nondominant RCA stenosis with normal LV function. He was admitted with unstable angina 04/25/13 and underwent cardiac catheterization by Dr. Ellyn Hack revealing disease within his diagonal branch and obtuse marginal branch vein grafts. Dr. Ellyn Hack stented his circumflex vein graft with a bare-metal stent and I stented his diagonal branch vein graft with a bare metal stent the following day. He has been asymptomatic since.  PAD (peripheral artery disease) History of 75% left renal artery stenosis as well as moderate bilateral internal carotid artery stenosis all of which are followed by duplex ultrasound and have remained stable  HTN (hypertension) Controlled on current medications  Hyperlipidemia On statin therapy as well as fish oil with a total cholesterol measured 01/02/14 of 144 and  a high triglyceride level of 505 which he attributes to binge eating of ice cream.      Lorretta Harp MD [facility], [facility] 03/19/2014 9:02 AM

## 2014-03-28 IMAGING — CT CT ABD-PELV W/ CM
2 of 5 series · 15 of 46 positions shown, 17 images · IV contrast (OMNIPAQUE)
Comparison: None.

CLINICAL DATA: Elevated white blood cell count. Fatigue. Evaluate
for potential chronic lymphocytic leukemia (CLL).

EXAM:
CT CHEST, ABDOMEN, AND PELVIS WITH CONTRAST
TECHNIQUE: Multidetector CT imaging of the chest, abdomen and pelvis was
performed following the standard protocol during bolus
administration of intravenous contrast.
CONTRAST:  100mL OMNIPAQUE IOHEXOL 300 MG/ML  SOLN

[Series 2: cap with st · axial · 0.95mm/px · z∈[-653,-18]mm · 12 of 143 slices shown, 14 images]
[im 8/143  soft-tissue]
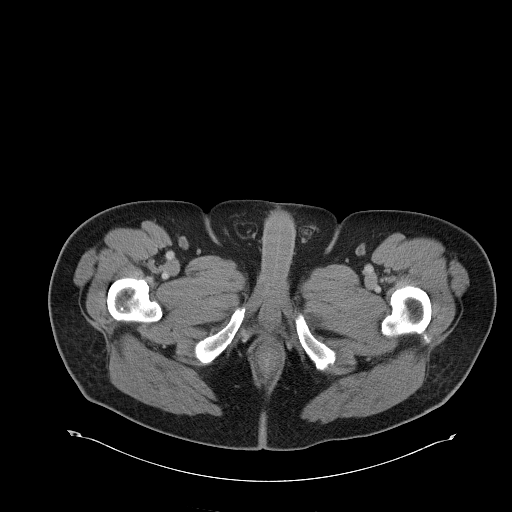
[im 8/143  bone]
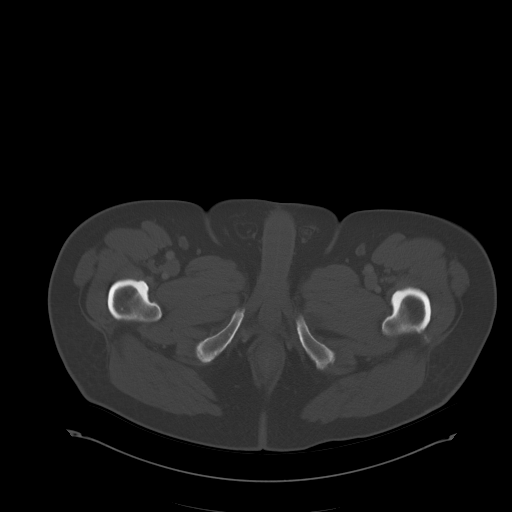
[im 24/143  soft-tissue]
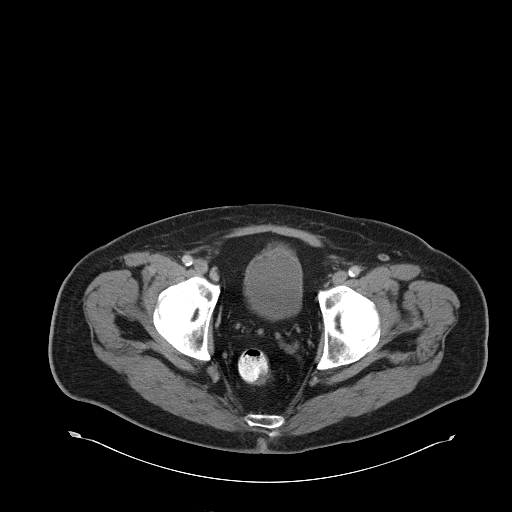
[im 32/143  soft-tissue]
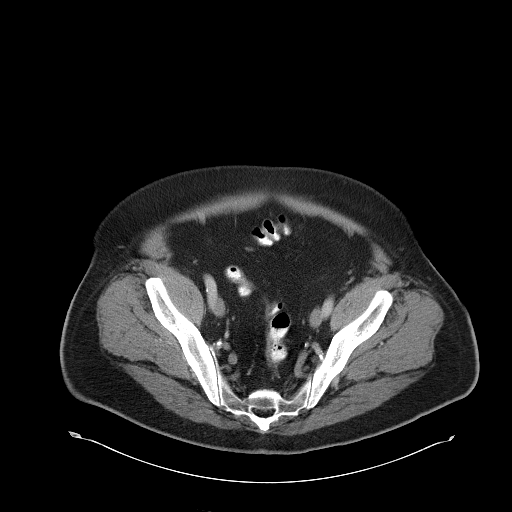
[im 40/143  soft-tissue]
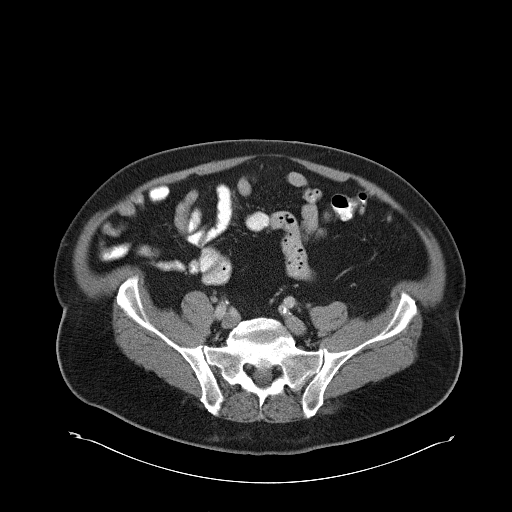
[im 56/143  soft-tissue]
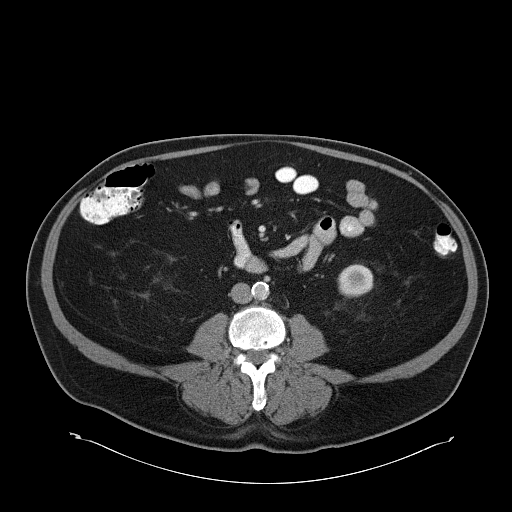
[im 64/143  soft-tissue]
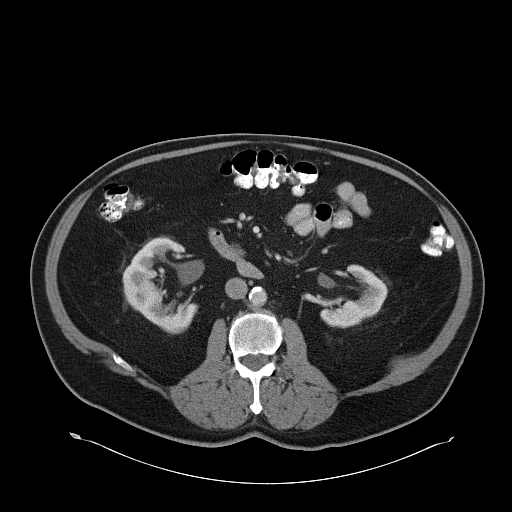
[im 79/143  soft-tissue]
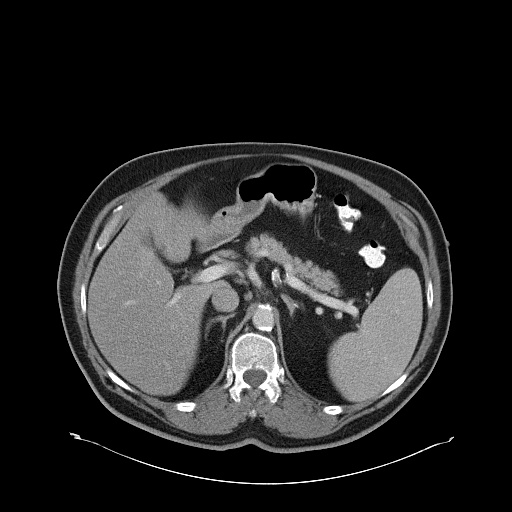
[im 87/143  soft-tissue]
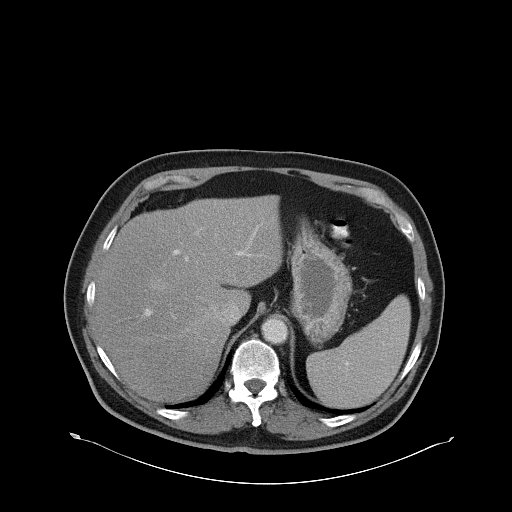
[im 103/143  soft-tissue]
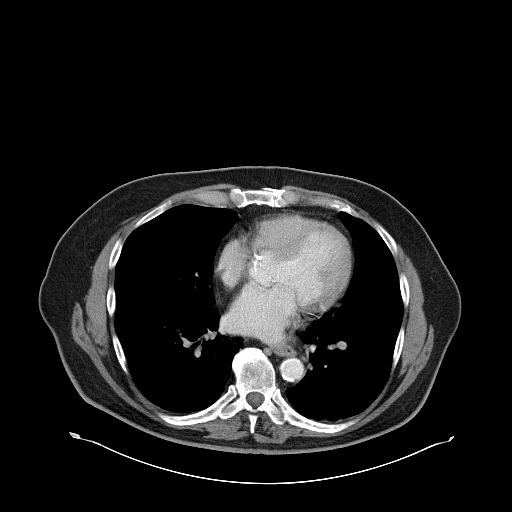
[im 103/143  bone]
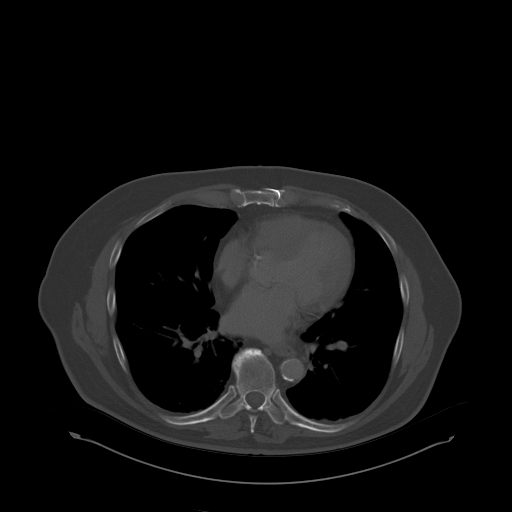
[im 111/143  soft-tissue]
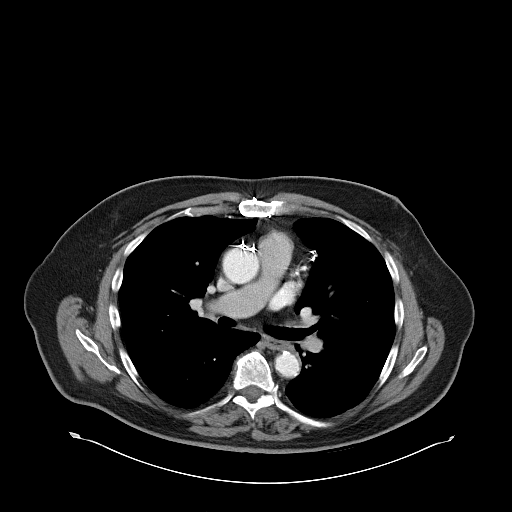
[im 119/143  soft-tissue]
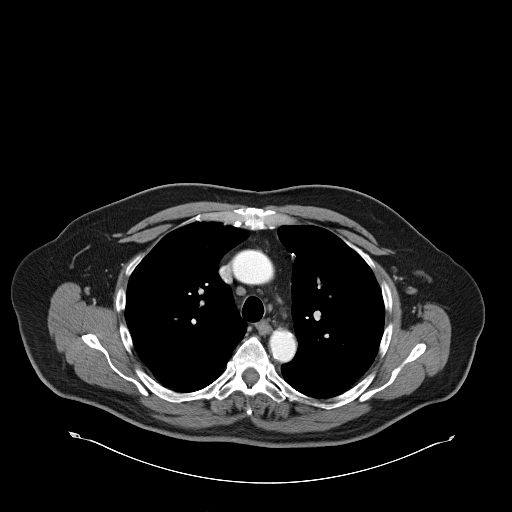
[im 135/143  soft-tissue]
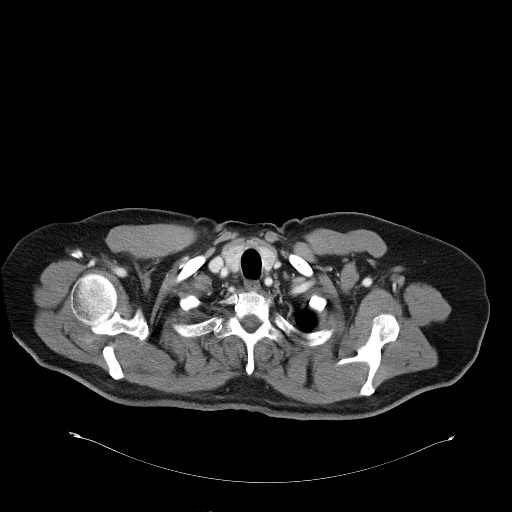

[Series 602: <mpr thick range> · coronal · 1.40mm/px · 3 of 106 slices shown]
[im 36/106  soft-tissue]
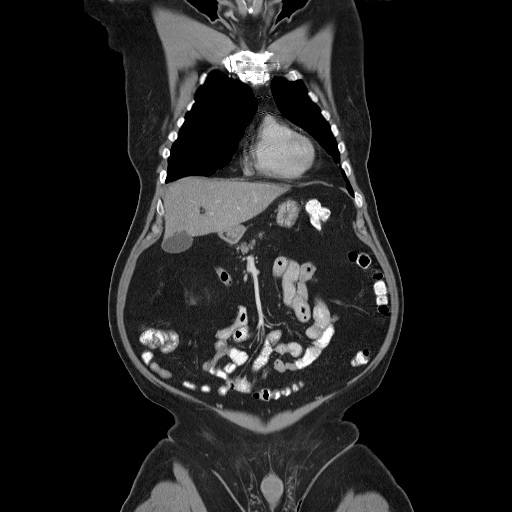
[im 47/106  soft-tissue]
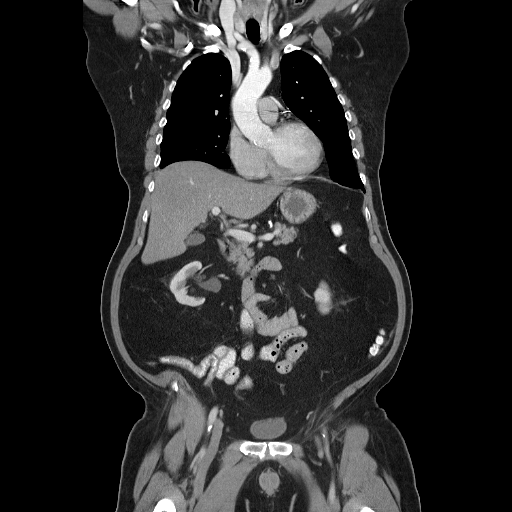
[im 59/106  soft-tissue]
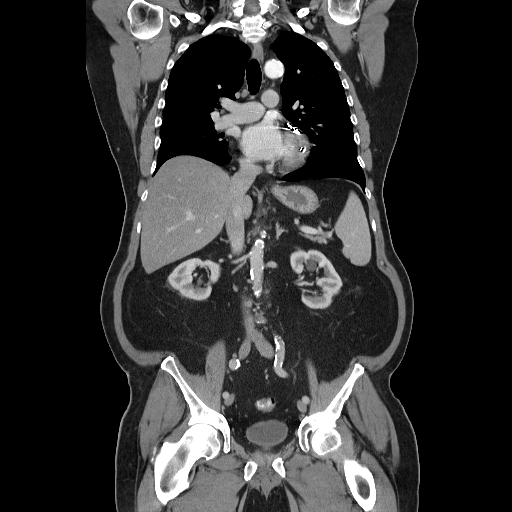

[15 of 46 positions shown; findings below may reference images not displayed]

FINDINGS: CT CHEST FINDINGS

Mediastinum: Heart size is normal. There is no significant
pericardial fluid, thickening or pericardial calcification. There is
atherosclerosis of the thoracic aorta, the great vessels of the
mediastinum and the coronary arteries, including calcified
atherosclerotic plaque in the left main, left anterior descending,
left circumflex and right coronary arteries. Status post median
sternotomy for CABG, including [REDACTED] to the LAD. No pathologically
enlarged mediastinal or hilar lymph nodes. Esophagus is unremarkable
in appearance.

Lungs/Pleura: 7 mm nodule in the right lower lobe adjacent to the
major fissure (image 38 of series 5). No other larger more
suspicious appearing pulmonary nodules or masses are otherwise
noted. No acute consolidative airspace disease. No pleural
effusions.

Musculoskeletal: No significant axillary lymphadenopathy. Median
sternotomy wires. There are no aggressive appearing lytic or blastic
lesions noted in the visualized portions of the skeleton.

CT ABDOMEN AND PELVIS FINDINGS

Abdomen/Pelvis: Mild left renal atrophy. Sub cm low-attenuation
lesion in the lower pole of the left kidney is too small to
definitively characterize. 3.2 cm simple cyst extending
exophytically from the medial aspect of the upper pole left kidney.
Sub cm low-attenuation lesion adjacent to the falciform ligament in
the left lobe of the liver is incompletely characterized on today's
examination, but is favored to represent a tiny cyst. No other
suspicious hepatic lesions are noted. The appearance the
gallbladder, pancreas and bilateral adrenal glands is unremarkable.
The spleen is mildly enlarged measuring 14.1 x 8.3 x 13.1 cm
(estimated splenic volume of 767 mL).

Extensive atherosclerosis throughout the abdominal and pelvic
vasculature, without evidence of aneurysm or dissection. No
significant volume of ascites. No pneumoperitoneum. No pathologic
distention of small bowel. No definite lymphadenopathy identified
within the abdomen or pelvis. Prostate gland and bladder are
unremarkable in appearance.

Musculoskeletal: There are no aggressive appearing lytic or blastic
lesions noted in the visualized portions of the skeleton.
IMPRESSION: 1. The only finding in the chest, abdomen or pelvis that is
concerning for potential lymphoproliferative disorder is the
presence of splenomegaly (estimated splenic volume of 767 mL). No
significant lymphadenopathy is identified.
2. No suspicious findings are otherwise noted to account for the
patient's elevated white blood cell count.
3. Atherosclerosis, including left main and 3 vessel coronary artery
disease. Status post median sternotomy for CABG, including [REDACTED] to
the LAD.
4. Additional incidental findings, as above.

## 2014-04-08 ENCOUNTER — Ambulatory Visit (INDEPENDENT_AMBULATORY_CARE_PROVIDER_SITE_OTHER): Payer: Medicare Other | Admitting: Ophthalmology

## 2014-04-09 ENCOUNTER — Encounter (HOSPITAL_COMMUNITY): Payer: Self-pay | Admitting: *Deleted

## 2014-04-22 ENCOUNTER — Telehealth (HOSPITAL_COMMUNITY): Payer: Self-pay | Admitting: *Deleted

## 2014-04-23 ENCOUNTER — Ambulatory Visit (HOSPITAL_COMMUNITY)
Admission: RE | Admit: 2014-04-23 | Discharge: 2014-04-23 | Disposition: A | Discharge status: Home / Self Care | Payer: Medicare Other | Source: Ambulatory Visit | Attending: Internal Medicine | Admitting: Internal Medicine

## 2014-04-23 ENCOUNTER — Encounter: Payer: Self-pay | Admitting: Cardiovascular Disease

## 2014-04-23 DIAGNOSIS — I6523 Occlusion and stenosis of bilateral carotid arteries: Secondary | ICD-10-CM | POA: Insufficient documentation

## 2014-04-23 DIAGNOSIS — I6529 Occlusion and stenosis of unspecified carotid artery: Secondary | ICD-10-CM

## 2014-04-23 DIAGNOSIS — I679 Cerebrovascular disease, unspecified: Secondary | ICD-10-CM

## 2014-04-23 NOTE — Progress Notes (Signed)
Carotid Duplex Completed. Tyheim Vanalstyne, BS, RDMS, RVT  

## 2014-04-25 ENCOUNTER — Encounter (HOSPITAL_COMMUNITY): Payer: Self-pay | Admitting: Cardiology

## 2014-05-03 ENCOUNTER — Ambulatory Visit (HOSPITAL_BASED_OUTPATIENT_CLINIC_OR_DEPARTMENT_OTHER): Payer: Medicare Other | Admitting: Oncology

## 2014-05-03 ENCOUNTER — Other Ambulatory Visit (HOSPITAL_BASED_OUTPATIENT_CLINIC_OR_DEPARTMENT_OTHER): Payer: Medicare Other

## 2014-05-03 ENCOUNTER — Telehealth: Payer: Self-pay | Admitting: Oncology

## 2014-05-03 VITALS — BP 137/93 | HR 42 | Temp 97.8°F | Resp 18 | Ht 70.0 in | Wt 220.7 lb

## 2014-05-03 DIAGNOSIS — C9110 Chronic lymphocytic leukemia of B-cell type not having achieved remission: Secondary | ICD-10-CM

## 2014-05-03 DIAGNOSIS — D479 Neoplasm of uncertain behavior of lymphoid, hematopoietic and related tissue, unspecified: Secondary | ICD-10-CM

## 2014-05-03 LAB — COMPREHENSIVE METABOLIC PANEL (CC13)
ALT: 30 U/L (ref 0–55)
ANION GAP: 10 meq/L (ref 3–11)
AST: 22 U/L (ref 5–34)
Albumin: 3.8 g/dL (ref 3.5–5.0)
Alkaline Phosphatase: 85 U/L (ref 40–150)
BUN: 23.9 mg/dL (ref 7.0–26.0)
CALCIUM: 9.1 mg/dL (ref 8.4–10.4)
CHLORIDE: 107 meq/L (ref 98–109)
CO2: 22 mEq/L (ref 22–29)
CREATININE: 1.2 mg/dL (ref 0.7–1.3)
EGFR: 63 mL/min/{1.73_m2} — ABNORMAL LOW (ref >90)
Glucose: 140 mg/dl (ref 70–140)
Potassium: 4.3 mEq/L (ref 3.5–5.1)
SODIUM: 140 meq/L (ref 136–145)
TOTAL PROTEIN: 6.7 g/dL (ref 6.4–8.3)
Total Bilirubin: 0.81 mg/dL (ref 0.20–1.20)

## 2014-05-03 LAB — CBC WITH DIFFERENTIAL/PLATELET
BASO%: 0.2 % (ref 0.0–2.0)
Basophils Absolute: 0 10*3/uL (ref 0.0–0.1)
EOS ABS: 0.3 10*3/uL (ref 0.0–0.5)
EOS%: 1.9 % (ref 0.0–7.0)
HCT: 41.7 % (ref 38.4–49.9)
HGB: 13.7 g/dL (ref 13.0–17.1)
LYMPH%: 76.6 % — AB (ref 14.0–49.0)
MCH: 29.3 pg (ref 27.2–33.4)
MCHC: 32.9 g/dL (ref 32.0–36.0)
MCV: 89.3 fL (ref 79.3–98.0)
MONO#: 0.7 10*3/uL (ref 0.1–0.9)
MONO%: 4 % (ref 0.0–14.0)
NEUT%: 17.3 % — ABNORMAL LOW (ref 39.0–75.0)
NEUTROS ABS: 2.9 10*3/uL (ref 1.5–6.5)
PLATELETS: 114 10*3/uL — AB (ref 140–400)
RBC: 4.67 10*6/uL (ref 4.20–5.82)
RDW: 14.1 % (ref 11.0–14.6)
WBC: 16.6 10*3/uL — ABNORMAL HIGH (ref 4.0–10.3)
lymph#: 12.7 10*3/uL — ABNORMAL HIGH (ref 0.9–3.3)

## 2014-05-03 NOTE — Progress Notes (Signed)
Hematology and Oncology Follow Up Visit  Austin Herman [id number] [birth year] 73 y.o. 05/03/2014 8:54 AM Austin Herman, MDPaterson, Austin Quince, MD   Principle Diagnosis: 73 year old gentleman with a lymphocytosis diagnosed in November of 2014. 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   Prior Therapy: He is status post flow cytometry and CT scan which did not show any diffuse lymphadenopathy but slightly enlarged spleen and suggestion of a lymphoproliferative disorder.  Current therapy: Observation and surveillance.  Interim History:  Austin Herman presents today for a followup visit. Since his last visit, he feels well. He continues to be very active and performs activities of daily living. He is not reporting any fevers or chills or sweats. Does not report any constitutional symptoms or any lymphadenopathy. He has not reported any recurrent infections has not reported any genitourinary complaints. He continued to perform activities of daily living without any hindrance or decline. He is not reporting any abdominal fullness or early satiety. There is no pain nausea or vomiting or change in his bowel habits. He did not report any chest pain or palpitation. Does note shortness of breath or wheezing. He does not report any cough or hemoptysis. He does not report any frequency urgency or urinary symptoms. He does not have any petechiae or rashes. Does not report any bleeding or clotting tendencies. Rest of his review of systems unremarkable  Medications: I have reviewed the patient's current medications.  Current Outpatient Prescriptions  Medication Sig Dispense Refill  . aspirin EC 81 MG tablet Take 81 mg by mouth daily.    . cetirizine (ZYRTEC) 10 MG tablet Take 10 mg by mouth daily.    . CYANOCOBALAMIN PO Take 1,200 mg by mouth daily.    . Dapagliflozin Propanediol (FARXIGA) 10 MG TABS Take 10 mg by mouth daily.    Marland Kitchen DIOVAN 160 MG tablet  TAKE 1 TABLET BY MOUTH EVERY DAY 30 tablet 9  . doxazosin (CARDURA) 2 MG tablet Take 2 mg by mouth at bedtime.     . fluticasone (FLONASE) 50 MCG/ACT nasal spray Place 1 spray into the nose daily as needed for allergies.     Marland Kitchen HYDROcodone-acetaminophen (NORCO/VICODIN) 5-325 MG per tablet Take 1 tablet by mouth daily as needed for moderate pain.     . isosorbide mononitrate (IMDUR) 30 MG 24 hr tablet Take 30 mg by mouth daily.    . metFORMIN (GLUCOPHAGE-XR) 500 MG 24 hr tablet Take 500 mg by mouth 2 (two) times daily.    . metoprolol succinate (TOPROL-XL) 25 MG 24 hr tablet TAKE 1 TABLET BY MOUTH EVERY DAY 30 tablet 9  . Multiple Vitamin (MULTIVITAMIN WITH MINERALS) TABS tablet Take 1 tablet by mouth daily.    Marland Kitchen NITROSTAT 0.4 MG SL tablet Place 0.4 mg under the tongue every 5 (five) minutes as needed for chest pain.     . Omega-3 Fatty Acids (FISH OIL) 1200 MG CAPS Take 1,200 mg by mouth daily.    . simvastatin (ZOCOR) 40 MG tablet Take 40 mg by mouth at bedtime.      . Ticagrelor (BRILINTA) 90 MG TABS tablet Take 1 tablet (90 mg total) by mouth 2 (two) times daily. 60 tablet 10  . vitamin C (ASCORBIC ACID) 500 MG tablet Take 500 mg by mouth 2 (two) times daily.    Marland Kitchen VYTORIN 10-40 MG per tablet Take 1 tablet by mouth daily.  3   No current facility-administered medications for this visit.  Allergies:  Allergies  Allergen Reactions  . Lipitor [Atorvastatin]   . Codeine Rash    Past Medical History, Surgical history, Social history, and Family History were reviewed and updated.   Physical Exam: Blood pressure 137/93, pulse 42, temperature 97.8 F (36.6 C), temperature source Oral, resp. rate 18, height 5\' 10"  (1.778 m), weight 220 lb 11.2 oz (100.109 kg), SpO2 99 %. ECOG:  0 General appearance: alert and cooperative not in any distress. Head: Normocephalic, without obvious abnormality Neck: no adenopathy Lymph nodes: Cervical, supraclavicular, and axillary nodes  normal. Heart:regular rate and rhythm, S1, S2 normal, no murmur, click, rub or gallop Lung:chest clear, no wheezing, rales, normal symmetric air entry Abdomin: soft, non-tender, without masses or organomegaly EXT:no erythema, induration, or nodules   Lab Results: Lab Results  Component Value Date   WBC 16.6* 05/03/2014   HGB 13.7 05/03/2014   HCT 41.7 05/03/2014   MCV 89.3 05/03/2014   PLT 114* 05/03/2014     Chemistry      Component Value Date/Time   NA 141 01/02/2014 0915   NA 139 01/02/2014 0802   K 4.2 01/02/2014 0915   K 4.5 01/02/2014 0802   CL 107 01/02/2014 0802   CO2 20* 01/02/2014 0915   CO2 20 01/02/2014 0802   BUN 24.1 01/02/2014 0915   BUN 22 01/02/2014 0802   CREATININE 1.5* 01/02/2014 0915   CREATININE 1.33 01/02/2014 0802   CREATININE 1.11 04/27/2013 0530      Component Value Date/Time   CALCIUM 8.9 01/02/2014 0915   CALCIUM 8.8 01/02/2014 0802   ALKPHOS 79 01/02/2014 0915   ALKPHOS 71 01/02/2014 0802   AST 20 01/02/2014 0915   AST 21 01/02/2014 0802   ALT 23 01/02/2014 0915   ALT 26 01/02/2014 0802   BILITOT 0.71 01/02/2014 0915   BILITOT 0.7 01/02/2014 0802        Impression and Plan:  73 year old gentleman with the following issues:  1. 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 The differential diagnosis again includes CLL versus mantle cell lymphoma. His CT scan and laboratory data showed slightly enlarged spleen.  Since the last visit, his laboratory testing as well as physical examination did not change. Regardless of the etiology of his lymphocytosis he is asymptomatic at this time and I do not see any indication for treatment. We will continue with observation and surveillance. She develops a rapid rise in his white cell count, painful lymphadenopathy, painful splenomegaly or any constitutional symptoms we will consider treatment at that time. Treatment will be in the form of systemic chemotherapy which I see no need for  at this time.  I continue to educate him about potential symptoms that could develop between visits. He is still alert me if he develops fevers, chills sweats or any constitutional symptoms. I also discussed with him if he develops symptoms of abdominal pain or early satiety he is to let us know soon as possible.  2. Splenomegaly: This is most likely related to his lymphoproliferative disorder and he continues to be asymptomatic at this point.  3. Followup will be in 7 months time.  [facility], MD 12/18/20158:54 AM

## 2014-05-03 NOTE — Telephone Encounter (Signed)
Gave avs & cal for July 2016 °

## 2014-06-17 ENCOUNTER — Telehealth (HOSPITAL_COMMUNITY): Payer: Self-pay | Admitting: *Deleted

## 2014-10-02 ENCOUNTER — Telehealth: Payer: Self-pay | Admitting: Oncology

## 2014-10-02 NOTE — Telephone Encounter (Signed)
Called and left a message with his new appointment

## 2014-11-11 ENCOUNTER — Other Ambulatory Visit: Payer: Self-pay

## 2014-11-27 ENCOUNTER — Other Ambulatory Visit: Payer: Medicare Other

## 2014-11-27 ENCOUNTER — Ambulatory Visit: Payer: Medicare Other | Admitting: Oncology

## 2014-12-10 LAB — IFOBT (OCCULT BLOOD): IFOBT: NEGATIVE

## 2014-12-12 ENCOUNTER — Ambulatory Visit (HOSPITAL_BASED_OUTPATIENT_CLINIC_OR_DEPARTMENT_OTHER): Payer: Medicare Other | Admitting: Oncology

## 2014-12-12 ENCOUNTER — Other Ambulatory Visit (HOSPITAL_BASED_OUTPATIENT_CLINIC_OR_DEPARTMENT_OTHER): Payer: Medicare Other

## 2014-12-12 ENCOUNTER — Telehealth: Payer: Self-pay | Admitting: Oncology

## 2014-12-12 VITALS — BP 149/66 | HR 58 | Temp 98.4°F | Resp 18 | Ht 70.0 in | Wt 221.9 lb

## 2014-12-12 DIAGNOSIS — D72820 Lymphocytosis (symptomatic): Secondary | ICD-10-CM

## 2014-12-12 DIAGNOSIS — C9110 Chronic lymphocytic leukemia of B-cell type not having achieved remission: Secondary | ICD-10-CM

## 2014-12-12 DIAGNOSIS — R161 Splenomegaly, not elsewhere classified: Secondary | ICD-10-CM

## 2014-12-12 LAB — CBC WITH DIFFERENTIAL/PLATELET
BASO%: 0.3 % (ref 0.0–2.0)
BASOS ABS: 0.1 10*3/uL (ref 0.0–0.1)
EOS ABS: 0.3 10*3/uL (ref 0.0–0.5)
EOS%: 1.7 % (ref 0.0–7.0)
HEMATOCRIT: 41 % (ref 38.4–49.9)
HEMOGLOBIN: 14 g/dL (ref 13.0–17.1)
LYMPH#: 13.4 10*3/uL — AB (ref 0.9–3.3)
LYMPH%: 74.3 % — AB (ref 14.0–49.0)
MCH: 30.2 pg (ref 27.2–33.4)
MCHC: 34.1 g/dL (ref 32.0–36.0)
MCV: 88.4 fL (ref 79.3–98.0)
MONO#: 0.6 10*3/uL (ref 0.1–0.9)
MONO%: 3.4 % (ref 0.0–14.0)
NEUT#: 3.7 10*3/uL (ref 1.5–6.5)
NEUT%: 20.3 % — ABNORMAL LOW (ref 39.0–75.0)
Platelets: 127 10*3/uL — ABNORMAL LOW (ref 140–400)
RBC: 4.64 10*6/uL (ref 4.20–5.82)
RDW: 13.8 % (ref 11.0–14.6)
WBC: 18 10*3/uL — ABNORMAL HIGH (ref 4.0–10.3)

## 2014-12-12 LAB — COMPREHENSIVE METABOLIC PANEL (CC13)
ALK PHOS: 86 U/L (ref 40–150)
ALT: 31 U/L (ref 0–55)
AST: 24 U/L (ref 5–34)
Albumin: 3.8 g/dL (ref 3.5–5.0)
Anion Gap: 8 mEq/L (ref 3–11)
BUN: 21.7 mg/dL (ref 7.0–26.0)
CHLORIDE: 107 meq/L (ref 98–109)
CO2: 23 meq/L (ref 22–29)
Calcium: 9.1 mg/dL (ref 8.4–10.4)
Creatinine: 1.1 mg/dL (ref 0.7–1.3)
EGFR: 66 mL/min/{1.73_m2} — ABNORMAL LOW (ref >90)
GLUCOSE: 250 mg/dL — AB (ref 70–140)
Potassium: 4.7 mEq/L (ref 3.5–5.1)
Sodium: 138 mEq/L (ref 136–145)
TOTAL PROTEIN: 6.9 g/dL (ref 6.4–8.3)
Total Bilirubin: 0.86 mg/dL (ref 0.20–1.20)

## 2014-12-12 NOTE — Telephone Encounter (Signed)
Pt confirmed labs/ov per 07/28 POF, gave pt AVS and Calendar... KJ

## 2014-12-12 NOTE — Progress Notes (Signed)
Hematology and Oncology Follow Up Visit  Austin Herman [id number] July 09, 1940 74 y.o. 12/12/2014 8:42 AM Austin Herman, MDPaterson, Austin Quince, MD   Principle Diagnosis: 74 year old gentleman with a lymphocytosis diagnosed in November of 2014. 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   Prior Therapy: He is status post flow cytometry and CT scan which did not show any diffuse lymphadenopathy but slightly enlarged spleen and suggestion of a lymphoproliferative disorder.  Current therapy: Observation and surveillance.  Interim History:  Austin Herman presents today for a followup visit by himself. Since his last visit, he reports no new issues. He continues to be very active and performs activities of daily living. He reports no fevers or chills or sweats. Does not report any constitutional symptoms or any lymphadenopathy. He has not reported any recurrent infections has not reported any genitourinary complaints. He is not reporting any abdominal fullness or early satiety. There is no pain nausea or vomiting or change in his bowel habits. He did not report any chest pain or palpitation. Does not report any  shortness of breath or wheezing. He does not report any cough or hemoptysis. He does not report any frequency urgency or urinary symptoms. He does not have any petechiae or rashes. Does not report any bleeding or clotting tendencies. Rest of his review of systems unremarkable  Medications: I have reviewed the patient's current medications.  Current Outpatient Prescriptions  Medication Sig Dispense Refill  . aspirin EC 81 MG tablet Take 81 mg by mouth daily.    . cetirizine (ZYRTEC) 10 MG tablet Take 10 mg by mouth daily.    . CYANOCOBALAMIN PO Take 1,200 mg by mouth daily.    . Dapagliflozin Propanediol (FARXIGA) 10 MG TABS Take 10 mg by mouth daily.    Marland Kitchen DIOVAN 160 MG tablet TAKE 1 TABLET BY MOUTH EVERY DAY 30 tablet 9  . doxazosin  (CARDURA) 2 MG tablet Take 2 mg by mouth at bedtime.     . fluticasone (FLONASE) 50 MCG/ACT nasal spray Place 1 spray into the nose daily as needed for allergies.     Marland Kitchen HYDROcodone-acetaminophen (NORCO/VICODIN) 5-325 MG per tablet Take 1 tablet by mouth daily as needed for moderate pain.     . isosorbide mononitrate (IMDUR) 30 MG 24 hr tablet Take 30 mg by mouth daily.    . metFORMIN (GLUCOPHAGE-XR) 500 MG 24 hr tablet Take 500 mg by mouth 2 (two) times daily.    . metoprolol succinate (TOPROL-XL) 25 MG 24 hr tablet TAKE 1 TABLET BY MOUTH EVERY DAY 30 tablet 9  . Multiple Vitamin (MULTIVITAMIN WITH MINERALS) TABS tablet Take 1 tablet by mouth daily.    Marland Kitchen NITROSTAT 0.4 MG SL tablet Place 0.4 mg under the tongue every 5 (five) minutes as needed for chest pain.     . Omega-3 Fatty Acids (FISH OIL) 1200 MG CAPS Take 1,200 mg by mouth daily.    . simvastatin (ZOCOR) 40 MG tablet Take 40 mg by mouth at bedtime.      . Ticagrelor (BRILINTA) 90 MG TABS tablet Take 1 tablet (90 mg total) by mouth 2 (two) times daily. 60 tablet 10  . vitamin C (ASCORBIC ACID) 500 MG tablet Take 500 mg by mouth 2 (two) times daily.    Marland Kitchen VYTORIN 10-40 MG per tablet Take 1 tablet by mouth daily.  3   No current facility-administered medications for this visit.     Allergies:  Allergies  Allergen Reactions  .  Lipitor [Atorvastatin]   . Codeine Rash    Past Medical History, Surgical history, Social history, and Family History were reviewed and updated.   Physical Exam: Blood pressure 149/66, pulse 58, temperature 98.4 F (36.9 C), temperature source Oral, resp. rate 18, height 5\' 10"  (1.778 m), weight 221 lb 14.4 oz (100.653 kg), SpO2 98 %. ECOG:  0 General appearance: alert and cooperative appeared younger than stated age.  Head: Normocephalic, without obvious abnormality Neck: no adenopathy Lymph nodes: Cervical, supraclavicular, and axillary nodes normal. Heart:regular rate and rhythm, S1, S2 normal, no  murmur, click, rub or gallop Lung:chest clear, no wheezing, rales, normal symmetric air entry Abdomin: soft, non-tender, without masses or organomegaly EXT:no erythema, induration, or nodules   Lab Results: Lab Results  Component Value Date   WBC 18.0* 12/12/2014   HGB 14.0 12/12/2014   HCT 41.0 12/12/2014   MCV 88.4 12/12/2014   PLT 127* 12/12/2014     Chemistry      Component Value Date/Time   NA 140 05/03/2014 0831   NA 139 01/02/2014 0802   K 4.3 05/03/2014 0831   K 4.5 01/02/2014 0802   CL 107 01/02/2014 0802   CO2 22 05/03/2014 0831   CO2 20 01/02/2014 0802   BUN 23.9 05/03/2014 0831   BUN 22 01/02/2014 0802   CREATININE 1.2 05/03/2014 0831   CREATININE 1.33 01/02/2014 0802   CREATININE 1.11 04/27/2013 0530      Component Value Date/Time   CALCIUM 9.1 05/03/2014 0831   CALCIUM 8.8 01/02/2014 0802   ALKPHOS 85 05/03/2014 0831   ALKPHOS 71 01/02/2014 0802   AST 22 05/03/2014 0831   AST 21 01/02/2014 0802   ALT 30 05/03/2014 0831   ALT 26 01/02/2014 0802   BILITOT 0.81 05/03/2014 0831   BILITOT 0.7 01/02/2014 0802        Impression and Plan:  74 year old gentleman with the following issues:  1. 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 The differential diagnosis again includes CLL versus mantle cell lymphoma. His CT scan in 2014 showed slightly enlarged spleen.   Since the last visit, his laboratory testing as well as physical examination did not change. He continues to asymptomatic. I educated him about sign and symptoms of progressive lymphoma including fever, chills or weight loss. The plan is to continue active surveillance with lab and physical exams routinely and repeat CT scan as needed. I see no indications to treat at this time.   2. Splenomegaly: This is most likely related to his lymphoproliferative disorder and he continues to be asymptomatic at this point.  3. Followup will be in 6 months time.  [facility],  MD 7/28/20168:42 AM

## 2015-02-27 ENCOUNTER — Other Ambulatory Visit (HOSPITAL_COMMUNITY): Payer: Self-pay | Admitting: Cardiovascular Disease

## 2015-04-15 ENCOUNTER — Ambulatory Visit (INDEPENDENT_AMBULATORY_CARE_PROVIDER_SITE_OTHER): Payer: Medicare Other | Admitting: Cardiovascular Disease

## 2015-04-15 ENCOUNTER — Encounter: Payer: Self-pay | Admitting: Cardiovascular Disease

## 2015-04-15 DIAGNOSIS — E785 Hyperlipidemia, unspecified: Secondary | ICD-10-CM

## 2015-04-15 DIAGNOSIS — I10 Essential (primary) hypertension: Secondary | ICD-10-CM | POA: Diagnosis not present

## 2015-04-15 DIAGNOSIS — I739 Peripheral vascular disease, unspecified: Secondary | ICD-10-CM | POA: Diagnosis not present

## 2015-04-15 NOTE — Assessment & Plan Note (Signed)
History of CAD status post coronary artery bypass grafting in 1994. His last cardiac catheterization performed 04/25/13 in the setting of unstable angina was notable for stenting of the circumflex and diagonal branch vein grafts by Dr. Ellyn Hack and myself respectively on separate days with bare-metal stents. He denies chest pain or shortness of breath.

## 2015-04-15 NOTE — Assessment & Plan Note (Addendum)
History of hyperlipidemia on simvastatin followed by his PCP. Recent blood work performed by his PCP 04/15/15 revealed a total cholesterol 155, LDL 74 and HDL of 30. His triglyceride level was 253 however.

## 2015-04-15 NOTE — Assessment & Plan Note (Signed)
History of hypertension blood pressure measured at 144/72. He is on metoprolol. Continue current meds at current dosing

## 2015-04-15 NOTE — Assessment & Plan Note (Signed)
History of peripheral arterial disease with a angiographic documented 75% left renal artery stenosis and moderate bilateral internal carotid artery stenoses which we followed by duplex ultrasound. He is neurologically asymptomatic.

## 2015-04-15 NOTE — Progress Notes (Signed)
04/15/2015 Austin Herman   08-27-1940  [id number]  Primary Physician Donnajean Lopes, MD Primary Cardiologist: Lorretta Harp MD Renae Gloss   HPI:  The patient is a very pleasant 74 year old, mild to moderately overweight, married Caucasian male, father of 70, grandfather to 4 grandchildren whose wife unfortunately is wheelchair bound with multiple sclerosis which has been progressively worsening.I last saw him 03/19/14 .The patient is the primary caregiver and therefore is under a lot of stress at home. He has a history of CAD status post coronary artery bypass grafting in 1994. His other problems include hypertension and hyperlipidemia. He denied chest pain or shortness of breath. I cath'd him June 26, 2008, revealing a patent LIMA to his LAD, a patent vein to a diagonal branch and a patent sequential vein to OM1 and 2 with an 80% stenosis just beyond the distal insertion and a 70% mid nondominant RCA stenosis with normal LV function. 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 His last Myoview performed July 24, 2010, was nonischemic. Recent lab work performed by Dr. Philip Aspen revealed total cholesterol 193, LDL of 103 and HDL of 32., moderately increased compared to his prior readings.  He was admitted to the hospital on 04/25/13 with unstable angina and underwent cardiac catheterization by Dr. Glenetta Hew revealing high-grade stenosis in the circumflex obtuse marginal branch vein graft as well as in the diagonal branch the graft. He ruled out for myocardial infarction. Dr. Ellyn Hack stented his circumflex vein graft with a bare-metal stent to the left radial approach and the following day I stented the diagonal branch vein graft with a bare-metal stent. He was discharged the following day. We have been following his  renal and carotid Dopplers all of which have remained stable. Since I saw him in a year ago he's remained clinically stable.   Current Outpatient Prescriptions  Medication Sig Dispense Refill  . aspirin EC 81 MG tablet Take 81 mg by mouth daily.    . cetirizine (ZYRTEC) 10 MG tablet Take 10 mg by mouth daily.    . CYANOCOBALAMIN PO Take 1,200 mg by mouth daily.    . Dapagliflozin Propanediol (FARXIGA) 10 MG TABS Take 10 mg by mouth daily.    . fluticasone (FLONASE) 50 MCG/ACT nasal spray Place 1 spray into the nose daily as needed for allergies.     Marland Kitchen HYDROcodone-acetaminophen (NORCO/VICODIN) 5-325 MG per tablet Take 1 tablet by mouth daily as needed for moderate pain.     . metFORMIN (GLUCOPHAGE-XR) 500 MG 24 hr tablet Take 500 mg by mouth 2 (two) times daily.    . metoprolol succinate (TOPROL-XL) 25 MG 24 hr tablet TAKE 1 TABLET BY MOUTH EVERY DAY 30 tablet 1  . Multiple Vitamin (MULTIVITAMIN WITH MINERALS) TABS tablet Take 1 tablet by mouth daily.    Marland Kitchen NITROSTAT 0.4 MG SL tablet Place 0.4 mg under the tongue every 5 (five) minutes as needed for chest pain.     . Omega-3 Fatty Acids (FISH OIL) 1200 MG CAPS Take 1,200 mg by mouth daily.    . simvastatin (ZOCOR) 40 MG tablet Take 40 mg by mouth at bedtime.      . vitamin C (ASCORBIC ACID) 500 MG tablet Take 500 mg by mouth 2 (two) times daily.     No current facility-administered medications for  this visit.    Allergies  Allergen Reactions  . Codeine Rash  . Lipitor [Atorvastatin]     Social History   Social History  . Marital Status: Married    Spouse Name: N/A  . Number of Children: N/A  . Years of Education: N/A   Occupational History  . Not on file.   Social History Main Topics  . Smoking status: Former Smoker -- 20 years    Types: Cigars  . Smokeless tobacco: Former Systems developer    Types: [place] date: 04/14/2013     Comment: 04/25/2013 "chew occasionally; maybe once/week if that"  . Alcohol Use: Yes     Comment:  04/25/2013 "haven't drank a beer in months; never had problem w/it"  . Drug Use: No  . Sexual Activity: No   Other Topics Concern  . Not on file   Social History Narrative     Review of Systems: General: negative for chills, fever, night sweats or weight changes.  Cardiovascular: negative for chest pain, dyspnea on exertion, edema, orthopnea, palpitations, paroxysmal nocturnal dyspnea or shortness of breath Dermatological: negative for rash Respiratory: negative for cough or wheezing Urologic: negative for hematuria Abdominal: negative for nausea, vomiting, diarrhea, bright red blood per rectum, melena, or hematemesis Neurologic: negative for visual changes, syncope, or dizziness All other systems reviewed and are otherwise negative except as noted above.    Blood pressure 144/72, pulse 50, height 5\' 10"  (1.778 m), weight 217 lb (98.431 kg).  General appearance: alert and no distress Neck: no adenopathy, no carotid bruit, no JVD, supple, symmetrical, trachea midline and thyroid not enlarged, symmetric, no tenderness/mass/nodules Lungs: clear to auscultation bilaterally Heart: regular rate and rhythm, S1, S2 normal, no murmur, click, rub or gallop Extremities: extremities normal, atraumatic, no cyanosis or edema  EKG sinus bradycardia at 50 with first-degree AV block. I personally reviewed this EKG  ASSESSMENT AND PLAN:   PAD (peripheral artery disease) History of peripheral arterial disease with a angiographic documented 75% left renal artery stenosis and moderate bilateral internal carotid artery stenoses which we followed by duplex ultrasound. He is neurologically asymptomatic.  Hyperlipidemia History of hyperlipidemia on simvastatin followed by his PCP  HTN (hypertension) History of hypertension blood pressure measured at 144/72. He is on metoprolol. Continue current meds at current dosing  CAD - CABG in 1994 - s/p PCI + BMS to SVG-OM and SVG-diagonal branch  04/26/13 History of CAD status post coronary artery bypass grafting in 1994. His last cardiac catheterization performed 04/25/13 in the setting of unstable angina was notable for stenting of the circumflex and diagonal branch vein grafts by Dr. Ellyn Hack and myself respectively on separate days with bare-metal stents. He denies chest pain or shortness of breath.      Lorretta Harp MD FACP,FACC,FAHA, [facility] 04/15/2015 9:15 AM

## 2015-04-15 NOTE — Patient Instructions (Signed)
Medication Instructions:  Your physician recommends that you continue on your current medications as directed. Please refer to the Current Medication list given to you today.   Labwork: I will get your lab work from your Primary Care Physician.   Testing/Procedures: Your physician has requested that you have a carotid duplex. This test is an ultrasound of the carotid arteries in your neck. It looks at blood flow through these arteries that supply the brain with blood. Allow one hour for this exam. There are no restrictions or special instructions. Feb. 2014  Your physician has requested that you have a renal artery duplex. During this test, an ultrasound is used to evaluate blood flow to the kidneys. Allow one hour for this exam. Do not eat after midnight the day before and avoid carbonated beverages. Take your medications as you usually do. Feb. 2017    Follow-Up: Your physician wants you to follow-up in: 12 months with Dr. Gwenlyn Found. You will receive a reminder letter in the mail two months in advance. If you don't receive a letter, please call our office to schedule the follow-up appointment.   Any Other Special Instructions Will Be Listed Below (If Applicable).     If you need a refill on your cardiac medications before your next appointment, please call your pharmacy.

## 2015-06-13 ENCOUNTER — Ambulatory Visit (HOSPITAL_COMMUNITY)
Admission: RE | Admit: 2015-06-13 | Discharge: 2015-06-13 | Disposition: A | Discharge status: Home / Self Care | Payer: Medicare Other | Source: Ambulatory Visit | Attending: Cardiovascular Disease | Admitting: Cardiovascular Disease

## 2015-06-13 DIAGNOSIS — E785 Hyperlipidemia, unspecified: Secondary | ICD-10-CM | POA: Insufficient documentation

## 2015-06-13 DIAGNOSIS — I6523 Occlusion and stenosis of bilateral carotid arteries: Secondary | ICD-10-CM | POA: Diagnosis not present

## 2015-06-13 DIAGNOSIS — E119 Type 2 diabetes mellitus without complications: Secondary | ICD-10-CM | POA: Insufficient documentation

## 2015-06-13 DIAGNOSIS — I701 Atherosclerosis of renal artery: Secondary | ICD-10-CM | POA: Insufficient documentation

## 2015-06-13 DIAGNOSIS — I10 Essential (primary) hypertension: Secondary | ICD-10-CM | POA: Insufficient documentation

## 2015-06-13 DIAGNOSIS — I739 Peripheral vascular disease, unspecified: Secondary | ICD-10-CM

## 2015-06-13 DIAGNOSIS — I7389 Other specified peripheral vascular diseases: Secondary | ICD-10-CM | POA: Insufficient documentation

## 2015-06-17 ENCOUNTER — Other Ambulatory Visit (HOSPITAL_BASED_OUTPATIENT_CLINIC_OR_DEPARTMENT_OTHER): Payer: Medicare Other

## 2015-06-17 ENCOUNTER — Telehealth: Payer: Self-pay | Admitting: Oncology

## 2015-06-17 ENCOUNTER — Ambulatory Visit (HOSPITAL_BASED_OUTPATIENT_CLINIC_OR_DEPARTMENT_OTHER): Payer: Medicare Other | Admitting: Oncology

## 2015-06-17 VITALS — BP 136/72 | HR 52 | Temp 97.9°F | Resp 18 | Ht 70.0 in | Wt 222.2 lb

## 2015-06-17 DIAGNOSIS — D72820 Lymphocytosis (symptomatic): Secondary | ICD-10-CM

## 2015-06-17 DIAGNOSIS — D696 Thrombocytopenia, unspecified: Secondary | ICD-10-CM

## 2015-06-17 DIAGNOSIS — R161 Splenomegaly, not elsewhere classified: Secondary | ICD-10-CM | POA: Diagnosis not present

## 2015-06-17 LAB — CBC WITH DIFFERENTIAL/PLATELET
BASO%: 0.4 % (ref 0.0–2.0)
BASOS ABS: 0.1 10*3/uL (ref 0.0–0.1)
EOS ABS: 0.4 10*3/uL (ref 0.0–0.5)
EOS%: 1.6 % (ref 0.0–7.0)
HCT: 45.2 % (ref 38.4–49.9)
HEMOGLOBIN: 14.7 g/dL (ref 13.0–17.1)
LYMPH%: 76.6 % — AB (ref 14.0–49.0)
MCH: 29.2 pg (ref 27.2–33.4)
MCHC: 32.5 g/dL (ref 32.0–36.0)
MCV: 89.8 fL (ref 79.3–98.0)
MONO#: 0.8 10*3/uL (ref 0.1–0.9)
MONO%: 3.6 % (ref 0.0–14.0)
NEUT#: 4 10*3/uL (ref 1.5–6.5)
NEUT%: 17.8 % — ABNORMAL LOW (ref 39.0–75.0)
Platelets: 130 10*3/uL — ABNORMAL LOW (ref 140–400)
RBC: 5.04 10*6/uL (ref 4.20–5.82)
RDW: 14.6 % (ref 11.0–14.6)
WBC: 22.7 10*3/uL — ABNORMAL HIGH (ref 4.0–10.3)
lymph#: 17.4 10*3/uL — ABNORMAL HIGH (ref 0.9–3.3)

## 2015-06-17 LAB — TECHNOLOGIST REVIEW

## 2015-06-17 LAB — COMPREHENSIVE METABOLIC PANEL
ALBUMIN: 3.9 g/dL (ref 3.5–5.0)
ALK PHOS: 87 U/L (ref 40–150)
ALT: 25 U/L (ref 0–55)
ANION GAP: 11 meq/L (ref 3–11)
AST: 17 U/L (ref 5–34)
BUN: 24 mg/dL (ref 7.0–26.0)
CO2: 20 mEq/L — ABNORMAL LOW (ref 22–29)
Calcium: 9.5 mg/dL (ref 8.4–10.4)
Chloride: 109 mEq/L (ref 98–109)
Creatinine: 1.4 mg/dL — ABNORMAL HIGH (ref 0.7–1.3)
EGFR: 51 mL/min/{1.73_m2} — AB (ref >90)
GLUCOSE: 179 mg/dL — AB (ref 70–140)
POTASSIUM: 4.2 meq/L (ref 3.5–5.1)
SODIUM: 140 meq/L (ref 136–145)
Total Bilirubin: 0.66 mg/dL (ref 0.20–1.20)
Total Protein: 7.2 g/dL (ref 6.4–8.3)

## 2015-06-17 NOTE — Progress Notes (Signed)
Hematology and Oncology Follow Up Visit  HUAN IMBERT [id number] 1941/02/18 75 y.o. 06/17/2015 8:39 AM Donnajean Lopes, MDPaterson, [name], MD   Principle Diagnosis: 75 year old gentleman with a lymphocytosis diagnosed in November of 2014. 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   Prior Therapy: He is status post flow cytometry and CT scan which did not show any diffuse lymphadenopathy but slightly enlarged spleen and suggestion of a lymphoproliferative disorder.  Current therapy: Observation and surveillance.  Interim History:  [name] presents today for a followup visit by himself. Since his last visit, he continues to be in reasonable health. 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 Because of that, he became less active and have gained some weight. He reports no fevers or chills or sweats. Does not report any constitutional symptoms or any lymphadenopathy. He has not reported any recurrent infections has not reported any genitourinary complaints. He still able to perform activities of daily living without any decline. His quality of life have not changed.   Does not report any headaches, blurry vision, syncope or seizures. He is not reporting any abdominal fullness or early satiety. There is no pain nausea or vomiting or change in his bowel habits. He did not report any chest pain or palpitation. Does not report any  shortness of breath or wheezing. He does not report any cough or hemoptysis. He does not report any frequency urgency or urinary symptoms. He does not have any petechiae or rashes. Does not report any bleeding or clotting tendencies. Rest of his review of systems unremarkable  Medications: I have reviewed the patient's current medications.  Current Outpatient Prescriptions  Medication Sig Dispense Refill  . aspirin EC 81 MG tablet Take 81 mg by mouth daily.    .  cetirizine (ZYRTEC) 10 MG tablet Take 10 mg by mouth daily.    . CYANOCOBALAMIN PO Take 1,200 mg by mouth daily.    . Dapagliflozin Propanediol (FARXIGA) 10 MG TABS Take 10 mg by mouth daily.    . fluticasone (FLONASE) 50 MCG/ACT nasal spray Place 1 spray into the nose daily as needed for allergies.     Marland Kitchen HYDROcodone-acetaminophen (NORCO/VICODIN) 5-325 MG per tablet Take 1 tablet by mouth daily as needed for moderate pain.     . metFORMIN (GLUCOPHAGE-XR) 500 MG 24 hr tablet Take 500 mg by mouth 2 (two) times daily.    . metoprolol succinate (TOPROL-XL) 25 MG 24 hr tablet TAKE 1 TABLET BY MOUTH EVERY DAY 30 tablet 1  . Multiple Vitamin (MULTIVITAMIN WITH MINERALS) TABS tablet Take 1 tablet by mouth daily.    Marland Kitchen NITROSTAT 0.4 MG SL tablet Place 0.4 mg under the tongue every 5 (five) minutes as needed for chest pain.     . Omega-3 Fatty Acids (FISH OIL) 1200 MG CAPS Take 1,200 mg by mouth daily.    . simvastatin (ZOCOR) 40 MG tablet Take 40 mg by mouth at bedtime.      . vitamin C (ASCORBIC ACID) 500 MG tablet Take 500 mg by mouth 2 (two) times daily.     No current facility-administered medications for this visit.     Allergies:  Allergies  Allergen Reactions  . Codeine Rash  . Lipitor [Atorvastatin]     Past Medical History, Surgical history, Social history, and Family History were reviewed and updated.   Physical Exam: Blood pressure 136/72, pulse 52, temperature 97.9 F (36.6 C),  temperature source Oral, resp. rate 18, height 5\' 10"  (1.778 m), weight 222 lb 3.2 oz (100.789 kg), SpO2 98 %. ECOG:  0 General appearance: alert and cooperative appeared well without distress. Head: Normocephalic, without obvious abnormality and oral ulcers or lesions. Neck: no adenopathy Lymph nodes: Cervical, supraclavicular, and axillary nodes normal. Heart:regular rate and rhythm, S1, S2 normal, no murmur, click, rub or gallop Lung:chest clear, no wheezing, rales, normal symmetric air entry Abdomin:  soft, non-tender, without masses or organomegaly no shifting dullness or ascites. EXT:no erythema, induration, or nodules   Lab Results: Lab Results  Component Value Date   WBC 22.7* 06/17/2015   HGB 14.7 06/17/2015   HCT 45.2 06/17/2015   MCV 89.8 06/17/2015   PLT 130* 06/17/2015     Chemistry      Component Value Date/Time   NA 138 12/12/2014 0820   NA 139 01/02/2014 0802   K 4.7 12/12/2014 0820   K 4.5 01/02/2014 0802   CL 107 01/02/2014 0802   CO2 23 12/12/2014 0820   CO2 20 01/02/2014 0802   BUN 21.7 12/12/2014 0820   BUN 22 01/02/2014 0802   CREATININE 1.1 12/12/2014 0820   CREATININE 1.33 01/02/2014 0802   CREATININE 1.11 04/27/2013 0530      Component Value Date/Time   CALCIUM 9.1 12/12/2014 0820   CALCIUM 8.8 01/02/2014 0802   ALKPHOS 86 12/12/2014 0820   ALKPHOS 71 01/02/2014 0802   AST 24 12/12/2014 0820   AST 21 01/02/2014 0802   ALT 31 12/12/2014 0820   ALT 26 01/02/2014 0802   BILITOT 0.86 12/12/2014 0820   BILITOT 0.7 01/02/2014 0802        Impression and Plan:  75 year old gentleman with the following issues:  1. 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 The differential diagnosis again includes CLL versus mantle cell lymphoma. His CT scan in 2014 showed slightly enlarged spleen.   His laboratory data from today were reviewed and showed very little changes in his lymphocytosis. His total white cell count slightly elevated to 22. His lymphocyte percentage is at 76 which is not dramatically changed. His physical examination did not reveal any evidence of progression of his lymphoproliferative disorder.  Plan is to continue with active surveillance and repeat imaging studies as needed.  2. Splenomegaly: This is most likely related to his lymphoproliferative disorder. No palpable on his exam today.  3. Thrombocytopenia: His port count is actually improved in the last year without any evidence of bleeding. We will continue to  monitor this closely.  4. Followup will be in 6 months time.  [facility], MD 1/31/20178:39 AM

## 2015-06-17 NOTE — Telephone Encounter (Signed)
Talked with and scheduled this patient’s appointment(s) while patient was here in our office.       [facility]. °

## 2015-06-20 ENCOUNTER — Other Ambulatory Visit: Payer: Self-pay

## 2015-06-20 DIAGNOSIS — I779 Disorder of arteries and arterioles, unspecified: Secondary | ICD-10-CM

## 2015-06-20 DIAGNOSIS — I739 Peripheral vascular disease, unspecified: Principal | ICD-10-CM

## 2015-06-23 ENCOUNTER — Other Ambulatory Visit (HOSPITAL_COMMUNITY): Payer: Self-pay | Admitting: Cardiovascular Disease

## 2015-06-23 NOTE — Telephone Encounter (Signed)
Rx request sent to pharmacy.  

## 2015-12-16 ENCOUNTER — Ambulatory Visit (HOSPITAL_BASED_OUTPATIENT_CLINIC_OR_DEPARTMENT_OTHER): Payer: Medicare Other | Admitting: Oncology

## 2015-12-16 ENCOUNTER — Telehealth: Payer: Self-pay | Admitting: Oncology

## 2015-12-16 ENCOUNTER — Other Ambulatory Visit (HOSPITAL_BASED_OUTPATIENT_CLINIC_OR_DEPARTMENT_OTHER): Payer: Medicare Other

## 2015-12-16 VITALS — BP 137/59 | HR 60 | Temp 98.1°F | Resp 17 | Wt 226.9 lb

## 2015-12-16 DIAGNOSIS — D696 Thrombocytopenia, unspecified: Secondary | ICD-10-CM

## 2015-12-16 DIAGNOSIS — R161 Splenomegaly, not elsewhere classified: Secondary | ICD-10-CM

## 2015-12-16 DIAGNOSIS — D72820 Lymphocytosis (symptomatic): Secondary | ICD-10-CM

## 2015-12-16 DIAGNOSIS — C9110 Chronic lymphocytic leukemia of B-cell type not having achieved remission: Secondary | ICD-10-CM

## 2015-12-16 LAB — CBC WITH DIFFERENTIAL/PLATELET
BASO%: 0.4 % (ref 0.0–2.0)
Basophils Absolute: 0.1 10*3/uL (ref 0.0–0.1)
EOS ABS: 0.3 10*3/uL (ref 0.0–0.5)
EOS%: 1.4 % (ref 0.0–7.0)
HEMATOCRIT: 42 % (ref 38.4–49.9)
HGB: 13.5 g/dL (ref 13.0–17.1)
LYMPH#: 18.6 10*3/uL — AB (ref 0.9–3.3)
LYMPH%: 80.2 % — AB (ref 14.0–49.0)
MCH: 29 pg (ref 27.2–33.4)
MCHC: 32.1 g/dL (ref 32.0–36.0)
MCV: 90.4 fL (ref 79.3–98.0)
MONO#: 0.8 10*3/uL (ref 0.1–0.9)
MONO%: 3.4 % (ref 0.0–14.0)
NEUT%: 14.6 % — AB (ref 39.0–75.0)
NEUTROS ABS: 3.4 10*3/uL (ref 1.5–6.5)
PLATELETS: 135 10*3/uL — AB (ref 140–400)
RBC: 4.64 10*6/uL (ref 4.20–5.82)
RDW: 14.2 % (ref 11.0–14.6)
WBC: 23.3 10*3/uL — AB (ref 4.0–10.3)

## 2015-12-16 LAB — COMPREHENSIVE METABOLIC PANEL
ALK PHOS: 88 U/L (ref 40–150)
ALT: 48 U/L (ref 0–55)
ANION GAP: 9 meq/L (ref 3–11)
AST: 30 U/L (ref 5–34)
Albumin: 3.7 g/dL (ref 3.5–5.0)
BILIRUBIN TOTAL: 0.93 mg/dL (ref 0.20–1.20)
BUN: 23.4 mg/dL (ref 7.0–26.0)
CALCIUM: 9.9 mg/dL (ref 8.4–10.4)
CO2: 26 meq/L (ref 22–29)
CREATININE: 1.4 mg/dL — AB (ref 0.7–1.3)
Chloride: 106 mEq/L (ref 98–109)
EGFR: 49 mL/min/{1.73_m2} — AB (ref >90)
Glucose: 183 mg/dl — ABNORMAL HIGH (ref 70–140)
Potassium: 4.9 mEq/L (ref 3.5–5.1)
Sodium: 141 mEq/L (ref 136–145)
TOTAL PROTEIN: 7.1 g/dL (ref 6.4–8.3)

## 2015-12-16 LAB — TECHNOLOGIST REVIEW

## 2015-12-16 NOTE — Telephone Encounter (Signed)
called to confirm apts, mailbox full, apts will be mailed

## 2015-12-16 NOTE — Progress Notes (Signed)
Hematology and Oncology Follow Up Visit  ORLIN PITTA [id number] 1940-05-25 75 y.o. 12/16/2015 8:17 AM Donnajean Lopes, MDPaterson, [name], MD   Principle Diagnosis: 75 year old gentleman with a lymphocytosis diagnosed in November of 2014. This is due to lymphoproliferative disorder representing a CLL. Mantle cell lymphoma is felt to be less likely.   Prior Therapy: He is status post flow cytometry and CT scan which did not show any diffuse lymphadenopathy but slightly enlarged spleen and suggestion of a lymphoproliferative disorder.  Current therapy: Observation and surveillance.  Interim History:  [name] presents today for a followup visit by himself. Since his last visit, he reports no changes in his health. He continues to be reasonably active and performs activities of daily living. He does have chronic back pain which have not changed since the last visit. He reports no fevers or chills or sweats. Does not report any constitutional symptoms or any lymphadenopathy. He has not reported any recurrent infections. He did report small axillary lymphadenopathy. Appetite remains excellent and his weight not dramatically changed and have increased by 2 pounds.  Does not report any headaches, blurry vision, syncope or seizures. He is not reporting any abdominal fullness or early satiety. There is no pain nausea or vomiting or change in his bowel habits. He did not report any chest pain or palpitation. Does not report any  shortness of breath or wheezing. He does not report any cough or hemoptysis. He does not report any frequency urgency or urinary symptoms. He does not have any petechiae or rashes. Does not report any bleeding or clotting tendencies. Rest of his review of systems unremarkable  Medications: I have reviewed the patient's current medications.  Current Outpatient Prescriptions  Medication Sig Dispense Refill  . aspirin EC 81 MG tablet Take 81 mg by mouth daily.    . cetirizine  (ZYRTEC) 10 MG tablet Take 10 mg by mouth daily.    . CYANOCOBALAMIN PO Take 1,200 mg by mouth daily.    . Dapagliflozin Propanediol (FARXIGA) 10 MG TABS Take 10 mg by mouth daily.    . fluticasone (FLONASE) 50 MCG/ACT nasal spray Place 1 spray into the nose daily as needed for allergies.     Marland Kitchen HYDROcodone-acetaminophen (NORCO/VICODIN) 5-325 MG per tablet Take 1 tablet by mouth daily as needed for moderate pain.     . metFORMIN (GLUCOPHAGE-XR) 500 MG 24 hr tablet Take 500 mg by mouth 2 (two) times daily.    . metoprolol succinate (TOPROL-XL) 25 MG 24 hr tablet TAKE 1 TABLET BY MOUTH EVERY DAY 30 tablet 10  . Multiple Vitamin (MULTIVITAMIN WITH MINERALS) TABS tablet Take 1 tablet by mouth daily.    Marland Kitchen NITROSTAT 0.4 MG SL tablet Place 0.4 mg under the tongue every 5 (five) minutes as needed for chest pain.     . Omega-3 Fatty Acids (FISH OIL) 1200 MG CAPS Take 1,200 mg by mouth daily.    . simvastatin (ZOCOR) 40 MG tablet Take 40 mg by mouth at bedtime.      . vitamin C (ASCORBIC ACID) 500 MG tablet Take 500 mg by mouth 2 (two) times daily.     No current facility-administered medications for this visit.      Allergies:  Allergies  Allergen Reactions  . Codeine Rash  . Lipitor [Atorvastatin]     Past Medical History, Surgical history, Social history, and Family History were reviewed and updated.   Physical Exam: Blood pressure (!) 137/59, pulse 60, temperature 98.1 F (36.7 C),  temperature source Oral, resp. rate 17, weight 226 lb 14.4 oz (102.9 kg). ECOG:  0 General appearance: Well-appearing gentleman without distress. Head: Normocephalic, without obvious abnormality and oral thrush noted. Neck: no adenopathy Lymph nodes: Small right axillary lymph node noted. No lymphadenopathy noted in the cervical, inguinal or supraclavicular area. Heart:regular rate and rhythm, S1, S2 normal, no murmur, click, rub or gallop Lung:chest clear, no wheezing, rales, normal symmetric air  entry Abdomin: soft, non-tender, without masses or organomegaly no rebound or guarding. EXT:no erythema, induration, or nodules   Lab Results: Lab Results  Component Value Date   WBC 23.3 (H) 12/16/2015   HGB 13.5 12/16/2015   HCT 42.0 12/16/2015   MCV 90.4 12/16/2015   PLT 135 (L) 12/16/2015     Chemistry      Component Value Date/Time   NA 140 06/17/2015 0816   K 4.2 06/17/2015 0816   CL 107 01/02/2014 0802   CO2 20 (L) 06/17/2015 0816   BUN 24.0 06/17/2015 0816   CREATININE 1.4 (H) 06/17/2015 0816      Component Value Date/Time   CALCIUM 9.5 06/17/2015 0816   ALKPHOS 87 06/17/2015 0816   AST 17 06/17/2015 0816   ALT 25 06/17/2015 0816   BILITOT 0.66 06/17/2015 0816        Impression and Plan:  75 year old gentleman with the following issues:  1. 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 The differential diagnosis again includes CLL versus mantle cell lymphoma. His CT scan in 2014 showed slightly enlarged spleen.   His physical examination today did reveal a very small right axillary lymph node that his CBC is unchanged from previous counts. His total white cell count remained similar to what was 6 months ago. He has no signs or symptoms of progression of disease. I continue to educate him about the natural course of this illness and indication for treatment. These would include symptomatic lymphadenopathy, constitutional symptoms, cytopenias, and rapid doubling time of his white cell count. At this point he has not had these findings but certainly can change in the future.  Plan is to continue with active surveillance and repeat imaging studies as needed.  2. 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 Continues to be asymptomatic and not palpable.  3. Thrombocytopenia: Very mild and unchanged from previous counts. He has no signs and symptoms of bleeding.  4. Followup will be in 6 months time.  [facility], MD 8/1/20178:17 AM

## 2016-04-12 ENCOUNTER — Encounter: Payer: Self-pay | Admitting: *Deleted

## 2016-04-13 ENCOUNTER — Ambulatory Visit (INDEPENDENT_AMBULATORY_CARE_PROVIDER_SITE_OTHER): Payer: Medicare Other | Admitting: Cardiovascular Disease

## 2016-04-13 ENCOUNTER — Encounter: Payer: Self-pay | Admitting: Cardiovascular Disease

## 2016-04-13 DIAGNOSIS — I10 Essential (primary) hypertension: Secondary | ICD-10-CM | POA: Diagnosis not present

## 2016-04-13 DIAGNOSIS — I2510 Atherosclerotic heart disease of native coronary artery without angina pectoris: Secondary | ICD-10-CM

## 2016-04-13 DIAGNOSIS — E7800 Pure hypercholesterolemia, unspecified: Secondary | ICD-10-CM

## 2016-04-13 DIAGNOSIS — I447 Left bundle-branch block, unspecified: Secondary | ICD-10-CM

## 2016-04-13 DIAGNOSIS — I739 Peripheral vascular disease, unspecified: Secondary | ICD-10-CM

## 2016-04-13 DIAGNOSIS — I779 Disorder of arteries and arterioles, unspecified: Secondary | ICD-10-CM

## 2016-04-13 LAB — HM DIABETES EYE EXAM

## 2016-04-13 NOTE — Assessment & Plan Note (Signed)
History of CAD status post bypass grafting in 1994. His last cath 04/25/13 in the setting of unstable angina revealed a high-grade stenosis of the circumflex obtuse marginal branch graft as well as diagonal branch and graft. Dr. Ellyn Hack intervened on his obtuse marginal branch and vein graft via the left radial approach and I stented his diagonal branch and graft with a bare metal stent in a staged fashion. He has done well since and denies chest pain or shortness of breath.

## 2016-04-13 NOTE — Assessment & Plan Note (Signed)
History of hypertension with blood pressure 143/75. He is on metoprolol. Continue current meds at current dosing

## 2016-04-13 NOTE — Progress Notes (Signed)
04/13/2016 Austin Herman   March 27, 1941  [id number]  Primary Physician Donnajean Lopes, MD Primary Cardiologist: Lorretta Harp MD Lupe Carney, [place]  HPI:  The patient is a very pleasant 75 year old, mild to moderately overweight, married Caucasian male, father of 54, grandfather to 4 grandchildren whose wife unfortunately is wheelchair bound with multiple sclerosis which has been progressively worsening.I last saw him 04/15/15.The patient is the primary caregiver and therefore is under a lot of stress at home. He has a history of CAD status post coronary artery bypass grafting in 1994. His other problems include hypertension and hyperlipidemia. He denied chest pain or shortness of breath. I cath'd him June 26, 2008, revealing a patent LIMA to his LAD, a patent vein to a diagonal branch and a patent sequential vein to OM1 and 2 with an 80% stenosis just beyond the distal insertion and a 70% mid nondominant RCA stenosis with normal LV function. 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 His last Myoview performed July 24, 2010, was nonischemic. Recent lab work performed by Dr. Philip Aspen revealed total cholesterol 193, LDL of 103 and HDL of 32., moderately increased compared to his prior readings.  He was admitted to the hospital on 04/25/13 with unstable angina and underwent cardiac catheterization by Dr. Glenetta Hew revealing high-grade stenosis in the circumflex obtuse marginal branch vein graft as well as in the diagonal branch the graft. He ruled out for myocardial infarction. Dr. Ellyn Hack stented his circumflex vein graft with a bare-metal stent to the left radial approach and the following day I stented the diagonal branch vein graft with a bare-metal stent. He was discharged the following day. We have been following his  renal and carotid Dopplers all of which have remained stable. Since I saw him in a year ago he's remained clinically stable.   Current Outpatient Prescriptions  Medication Sig Dispense Refill  . aspirin EC 81 MG tablet Take 81 mg by mouth daily.    . CYANOCOBALAMIN PO Take 1,200 mg by mouth daily.    . Dapagliflozin Propanediol (FARXIGA) 10 MG TABS Take 10 mg by mouth daily.    . fluticasone (FLONASE) 50 MCG/ACT nasal spray Place 1 spray into the nose daily as needed for allergies.     Marland Kitchen HYDROcodone-acetaminophen (NORCO/VICODIN) 5-325 MG per tablet Take 1 tablet by mouth daily as needed for moderate pain.     . metFORMIN (GLUCOPHAGE-XR) 500 MG 24 hr tablet Take 500 mg by mouth 2 (two) times daily.    . metoprolol succinate (TOPROL-XL) 25 MG 24 hr tablet TAKE 1 TABLET BY MOUTH EVERY DAY 30 tablet 10  . Multiple Vitamin (MULTIVITAMIN WITH MINERALS) TABS tablet Take 1 tablet by mouth daily.    Marland Kitchen NITROSTAT 0.4 MG SL tablet Place 0.4 mg under the tongue every 5 (five) minutes as needed for chest pain.     . Omega-3 Fatty Acids (FISH OIL) 1200 MG CAPS Take 1,200 mg by mouth daily.    . simvastatin (ZOCOR) 40 MG tablet Take 40 mg by mouth at bedtime.      . vitamin C (ASCORBIC ACID) 500 MG tablet Take 500 mg by mouth 2 (two) times daily.     No current facility-administered medications for this visit.     Allergies  Allergen Reactions  . Codeine Rash  .  Lipitor [Atorvastatin]     Social History   Social History  . Marital status: Married    Spouse name: N/A  . Number of children: N/A  . Years of education: N/A   Occupational History  . Not on file.   Social History Main Topics  . Smoking status: Former Smoker    Years: 20.00    Types: Cigars  . Smokeless tobacco: Former Systems developer    Types: [employer] date: 04/14/2013     Comment: 04/25/2013 "chew occasionally; maybe once/week if that"  . Alcohol use Yes     Comment: 04/25/2013 "haven't drank a beer in months; never had problem  w/it"  . Drug use: No  . Sexual activity: No   Other Topics Concern  . Not on file   Social History Narrative  . No narrative on file     Review of Systems: General: negative for chills, fever, night sweats or weight changes.  Cardiovascular: negative for chest pain, dyspnea on exertion, edema, orthopnea, palpitations, paroxysmal nocturnal dyspnea or shortness of breath Dermatological: negative for rash Respiratory: negative for cough or wheezing Urologic: negative for hematuria Abdominal: negative for nausea, vomiting, diarrhea, bright red blood per rectum, melena, or hematemesis Neurologic: negative for visual changes, syncope, or dizziness All other systems reviewed and are otherwise negative except as noted above.    Blood pressure (!) 143/75, pulse 64, height 5' 10.5" (1.791 m), weight 222 lb 12.8 oz (101.1 kg).  General appearance: alert and no distress Neck: no adenopathy, no carotid bruit, no JVD, supple, symmetrical, trachea midline and thyroid not enlarged, symmetric, no tenderness/mass/nodules Lungs: clear to auscultation bilaterally Heart: regular rate and rhythm, S1, S2 normal, no murmur, click, rub or gallop Extremities: extremities normal, atraumatic, no cyanosis or edema  EKG normal sinus rhythm at 64 with nonspecific ST-T wave changes. Persantine this EKG  ASSESSMENT AND PLAN:   CAD - CABG in 1994 - s/p PCI + BMS to SVG-OM and SVG-diagonal branch 04/26/13 History of CAD status post bypass grafting in 1994. His last cath 04/25/13 in the setting of unstable angina revealed a high-grade stenosis of the circumflex obtuse marginal branch graft as well as diagonal branch and graft. Dr. Ellyn Hack intervened on his obtuse marginal branch and vein graft via the left radial approach and I stented his diagonal branch and graft with a bare metal stent in a staged fashion. He has done well since and denies chest pain or shortness of breath.  PAD (peripheral artery  disease) History of left renal artery stenosis followed by duplex ultrasound  HTN (hypertension) History of hypertension with blood pressure 143/75. He is on metoprolol. Continue current meds at current dosing  Hyperlipidemia History of hyperlipidemia on statin therapy  followed by his  PCP  LBBB (left bundle branch block) Chronic  Bilateral carotid artery disease ([place]) History of carotid artery disease with recent duplex performed 06/13/15 showing moderate bilateral ICA stenosis. This will be followed on an annual basis.      Lorretta Harp MD FACP,FACC,FAHA, [facility] 04/13/2016 9:14 AM

## 2016-04-13 NOTE — Assessment & Plan Note (Signed)
History of left renal artery stenosis followed by duplex ultrasound

## 2016-04-13 NOTE — Assessment & Plan Note (Signed)
History of carotid artery disease with recent duplex performed 06/13/15 showing moderate bilateral ICA stenosis. This will be followed on an annual basis.

## 2016-04-13 NOTE — Assessment & Plan Note (Signed)
History of hyperlipidemia on statin therapy followed by his PCP 

## 2016-04-13 NOTE — Assessment & Plan Note (Signed)
Chronic. 

## 2016-04-13 NOTE — Patient Instructions (Signed)
Medication Instructions: Your physician recommends that you continue on your current medications as directed. Please refer to the Current Medication list given to you today   Labwork: I will call Dr. Sharlett Iles to get labs.   Testing/Procedures: Your physician has requested that you schedule a carotid duplex for January-February 2018. This test is an ultrasound of the carotid arteries in your neck. It looks at blood flow through these arteries that supply the brain with blood. Allow one hour for this exam. There are no restrictions or special instructions.   Follow-Up: Your physician wants you to follow-up in: 1 year with Dr. Gwenlyn Found. You will receive a reminder letter in the mail two months in advance. If you don't receive a letter, please call our office to schedule the follow-up appointment.  If you need a refill on your cardiac medications before your next appointment, please call your pharmacy.

## 2016-06-13 ENCOUNTER — Telehealth: Payer: Self-pay | Admitting: Oncology

## 2016-06-13 NOTE — Telephone Encounter (Signed)
S/w pt, advised 2/2 appt moved to 2/28 @ 8.30 due to md cme.

## 2016-06-15 ENCOUNTER — Ambulatory Visit (HOSPITAL_COMMUNITY)
Admission: RE | Admit: 2016-06-15 | Discharge: 2016-06-15 | Disposition: A | Discharge status: Home / Self Care | Payer: Medicare Other | Source: Ambulatory Visit | Attending: Cardiovascular Disease | Admitting: Cardiovascular Disease

## 2016-06-15 DIAGNOSIS — I6523 Occlusion and stenosis of bilateral carotid arteries: Secondary | ICD-10-CM

## 2016-06-15 DIAGNOSIS — I779 Disorder of arteries and arterioles, unspecified: Secondary | ICD-10-CM

## 2016-06-15 DIAGNOSIS — I739 Peripheral vascular disease, unspecified: Secondary | ICD-10-CM

## 2016-06-18 ENCOUNTER — Other Ambulatory Visit: Payer: Medicare Other

## 2016-06-18 ENCOUNTER — Ambulatory Visit: Payer: Medicare Other | Admitting: Oncology

## 2016-06-25 ENCOUNTER — Other Ambulatory Visit: Payer: Self-pay | Admitting: Cardiovascular Disease

## 2016-06-25 DIAGNOSIS — I779 Disorder of arteries and arterioles, unspecified: Secondary | ICD-10-CM

## 2016-06-25 DIAGNOSIS — I739 Peripheral vascular disease, unspecified: Principal | ICD-10-CM

## 2016-07-02 ENCOUNTER — Ambulatory Visit: Payer: Medicare Other | Admitting: Oncology

## 2016-07-02 ENCOUNTER — Other Ambulatory Visit: Payer: Medicare Other

## 2016-07-14 ENCOUNTER — Ambulatory Visit (HOSPITAL_BASED_OUTPATIENT_CLINIC_OR_DEPARTMENT_OTHER): Payer: Medicare Other | Admitting: Oncology

## 2016-07-14 ENCOUNTER — Telehealth: Payer: Self-pay | Admitting: Oncology

## 2016-07-14 ENCOUNTER — Other Ambulatory Visit (HOSPITAL_BASED_OUTPATIENT_CLINIC_OR_DEPARTMENT_OTHER): Payer: Medicare Other

## 2016-07-14 VITALS — BP 133/83 | HR 60 | Temp 98.0°F | Resp 18 | Ht 70.5 in | Wt 220.8 lb

## 2016-07-14 DIAGNOSIS — D696 Thrombocytopenia, unspecified: Secondary | ICD-10-CM | POA: Diagnosis not present

## 2016-07-14 DIAGNOSIS — D72820 Lymphocytosis (symptomatic): Secondary | ICD-10-CM

## 2016-07-14 DIAGNOSIS — R161 Splenomegaly, not elsewhere classified: Secondary | ICD-10-CM | POA: Diagnosis not present

## 2016-07-14 DIAGNOSIS — C9110 Chronic lymphocytic leukemia of B-cell type not having achieved remission: Secondary | ICD-10-CM

## 2016-07-14 LAB — CBC WITH DIFFERENTIAL/PLATELET
BASO%: 0.2 % (ref 0.0–2.0)
BASOS ABS: 0.1 10*3/uL (ref 0.0–0.1)
EOS ABS: 0.3 10*3/uL (ref 0.0–0.5)
EOS%: 1.1 % (ref 0.0–7.0)
HEMATOCRIT: 44.3 % (ref 38.4–49.9)
HEMOGLOBIN: 14.7 g/dL (ref 13.0–17.1)
LYMPH#: 18.7 10*3/uL — AB (ref 0.9–3.3)
LYMPH%: 80.8 % — ABNORMAL HIGH (ref 14.0–49.0)
MCH: 29.6 pg (ref 27.2–33.4)
MCHC: 33.2 g/dL (ref 32.0–36.0)
MCV: 89.3 fL (ref 79.3–98.0)
MONO#: 0.7 10*3/uL (ref 0.1–0.9)
MONO%: 3.2 % (ref 0.0–14.0)
NEUT#: 3.4 10*3/uL (ref 1.5–6.5)
NEUT%: 14.7 % — AB (ref 39.0–75.0)
Platelets: 120 10*3/uL — ABNORMAL LOW (ref 140–400)
RBC: 4.96 10*6/uL (ref 4.20–5.82)
RDW: 13.5 % (ref 11.0–14.6)
WBC: 23.1 10*3/uL — ABNORMAL HIGH (ref 4.0–10.3)

## 2016-07-14 LAB — COMPREHENSIVE METABOLIC PANEL
ALBUMIN: 4 g/dL (ref 3.5–5.0)
ALK PHOS: 93 U/L (ref 40–150)
ALT: 40 U/L (ref 0–55)
AST: 22 U/L (ref 5–34)
Anion Gap: 13 mEq/L — ABNORMAL HIGH (ref 3–11)
BUN: 24.2 mg/dL (ref 7.0–26.0)
CALCIUM: 9.5 mg/dL (ref 8.4–10.4)
CHLORIDE: 105 meq/L (ref 98–109)
CO2: 21 mEq/L — ABNORMAL LOW (ref 22–29)
Creatinine: 1.2 mg/dL (ref 0.7–1.3)
EGFR: 60 mL/min/{1.73_m2} — AB (ref >90)
Glucose: 218 mg/dl — ABNORMAL HIGH (ref 70–140)
POTASSIUM: 4.5 meq/L (ref 3.5–5.1)
Sodium: 139 mEq/L (ref 136–145)
Total Bilirubin: 0.77 mg/dL (ref 0.20–1.20)
Total Protein: 7.4 g/dL (ref 6.4–8.3)

## 2016-07-14 LAB — TECHNOLOGIST REVIEW

## 2016-07-14 NOTE — Progress Notes (Signed)
Hematology and Oncology Follow Up Visit  Austin Herman [id number] 06-22-1940 76 y.o. 07/14/2016 8:56 AM Donnajean Lopes, MDPaterson, [name], MD   Principle Diagnosis: 76 year old gentleman with a lymphocytosis diagnosed in November of 2014. This is due to lymphoproliferative disorder representing a CLL.    Prior Therapy: He is status post flow cytometry and CT scan which did not show any diffuse lymphadenopathy but slightly enlarged spleen and suggestion of a lymphoproliferative disorder.  Current therapy: Observation and surveillance.  Interim History:  Austin Herman presents today for a followup visit by himself. Since his last visit, he continues to do very well without any changes in his health. His appetite remain excellent and have gained weight. He reports no fevers or chills or sweats. Does not report any constitutional symptoms or any lymphadenopathy. He has not reported any recurrent infections. He denied any worsening back pain or pathological fractures.   Does not report any headaches, blurry vision, syncope or seizures. He is not reporting any abdominal fullness or early satiety. There is no pain nausea or vomiting or change in his bowel habits. He did not report any chest pain or palpitation. Does not report any  shortness of breath or wheezing. He does not report any cough or hemoptysis. He does not report any frequency urgency or urinary symptoms. He does not have any petechiae or rashes. Does not report any bleeding or clotting tendencies. Rest of his review of systems unremarkable  Medications: I have reviewed the patient's current medications.  Current Outpatient Prescriptions  Medication Sig Dispense Refill  . aspirin EC 81 MG tablet Take 81 mg by mouth daily.    . CYANOCOBALAMIN PO Take 1,200 mg by mouth daily.    . Dapagliflozin Propanediol (FARXIGA) 10 MG TABS Take 10 mg by mouth daily.    . fluticasone (FLONASE) 50 MCG/ACT nasal spray Place 1 spray into the nose daily  as needed for allergies.     Marland Kitchen gabapentin (NEURONTIN) 300 MG capsule Take 1 capsule by mouth at bedtime.  3  . HYDROcodone-acetaminophen (NORCO/VICODIN) 5-325 MG per tablet Take 1 tablet by mouth daily as needed for moderate pain.     . metFORMIN (GLUCOPHAGE-XR) 500 MG 24 hr tablet Take 500 mg by mouth 2 (two) times daily.    . metoprolol succinate (TOPROL-XL) 25 MG 24 hr tablet TAKE 1 TABLET BY MOUTH EVERY DAY 30 tablet 10  . Multiple Vitamin (MULTIVITAMIN WITH MINERALS) TABS tablet Take 1 tablet by mouth daily.    . Omega-3 Fatty Acids (FISH OIL) 1200 MG CAPS Take 1,200 mg by mouth daily.    . simvastatin (ZOCOR) 40 MG tablet Take 40 mg by mouth at bedtime.      . tamsulosin (FLOMAX) 0.4 MG CAPS capsule Take 0.4 mg by mouth daily after supper.    . vitamin C (ASCORBIC ACID) 500 MG tablet Take 500 mg by mouth 2 (two) times daily.    Marland Kitchen NITROSTAT 0.4 MG SL tablet Place 0.4 mg under the tongue every 5 (five) minutes as needed for chest pain.      No current facility-administered medications for this visit.      Allergies:  Allergies  Allergen Reactions  . Codeine Rash  . Lipitor [Atorvastatin] Itching and Rash    Past Medical History, Surgical history, Social history, and Family History were reviewed and updated.   Physical Exam: Vitals personally reviewed today and showed a blood pressure 158/83. His pulse is 66 temperature is 90.7. His respiration 18 and O2  saturation is 97%. ECOG:  0 General appearance: Alert, awake gentleman appeared without distress. Head: Normocephalic, without obvious abnormality and oral ulcers or lesions. Neck: no adenopathy Lymph nodes: Small right axillary lymph node noted. No lymphadenopathy noted in the cervical, inguinal or supraclavicular area. Heart:regular rate and rhythm, S1, S2 normal, no murmur, click, rub or gallop Lung:chest clear, no wheezing, rales, normal symmetric air entry Abdomin: soft, non-tender, without masses or organomegaly no  shifting dullness or ascites. EXT:no erythema, induration, or nodules   Lab Results: Lab Results  Component Value Date   WBC 23.1 (H) 07/14/2016   HGB 14.7 07/14/2016   HCT 44.3 07/14/2016   MCV 89.3 07/14/2016   PLT 120 (L) 07/14/2016     Chemistry      Component Value Date/Time   NA 141 12/16/2015 0750   K 4.9 12/16/2015 0750   CL 107 01/02/2014 0802   CO2 26 12/16/2015 0750   BUN 23.4 12/16/2015 0750   CREATININE 1.4 (H) 12/16/2015 0750      Component Value Date/Time   CALCIUM 9.9 12/16/2015 0750   ALKPHOS 88 12/16/2015 0750   AST 30 12/16/2015 0750   ALT 48 12/16/2015 0750   BILITOT 0.93 12/16/2015 0750        Impression and Plan:  76 year old gentleman with the following issues:  1. 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 The differential diagnosis again includes CLL versus mantle cell lymphoma. His CT scan in 2014 showed slightly enlarged spleen.   Laboratory data reviewed today and his lymphocytosis has not changed from previous examinations. His physical examination does not reveal any evidence to suggest progression of disease. I have recommended continued observation and surveillance and I educated him about indication for treatment. These would include rapid rise in his white cell count, symptomatic lymphadenopathy, constitutional symptoms, and worsening cytopenias.    2. 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 Continues to be asymptomatic.  3. Thrombocytopenia: Very mild and unchanged from previous counts. He has no signs and symptoms of bleeding. No intervention is needed at this time.  4. Followup will be in 6 months time.  [facility], MD 2/28/20188:56 AM

## 2016-07-14 NOTE — Telephone Encounter (Signed)
Gave patient AVS and calender for 01/26/17 appt per 07/14/16 los

## 2016-11-28 ENCOUNTER — Other Ambulatory Visit (HOSPITAL_COMMUNITY): Payer: Self-pay | Admitting: Cardiovascular Disease

## 2016-11-29 NOTE — Telephone Encounter (Signed)
Rx has been sent to the pharmacy electronically. ° °

## 2017-01-26 ENCOUNTER — Ambulatory Visit (HOSPITAL_BASED_OUTPATIENT_CLINIC_OR_DEPARTMENT_OTHER): Payer: Medicare Other | Admitting: Oncology

## 2017-01-26 ENCOUNTER — Other Ambulatory Visit (HOSPITAL_BASED_OUTPATIENT_CLINIC_OR_DEPARTMENT_OTHER): Payer: Medicare Other

## 2017-01-26 ENCOUNTER — Telehealth: Payer: Self-pay | Admitting: Oncology

## 2017-01-26 VITALS — BP 116/59 | HR 58 | Temp 98.0°F | Resp 17 | Ht 70.5 in | Wt 210.5 lb

## 2017-01-26 DIAGNOSIS — D72820 Lymphocytosis (symptomatic): Secondary | ICD-10-CM

## 2017-01-26 DIAGNOSIS — D696 Thrombocytopenia, unspecified: Secondary | ICD-10-CM | POA: Diagnosis not present

## 2017-01-26 DIAGNOSIS — C9110 Chronic lymphocytic leukemia of B-cell type not having achieved remission: Secondary | ICD-10-CM

## 2017-01-26 DIAGNOSIS — D72829 Elevated white blood cell count, unspecified: Secondary | ICD-10-CM

## 2017-01-26 DIAGNOSIS — R161 Splenomegaly, not elsewhere classified: Secondary | ICD-10-CM | POA: Diagnosis not present

## 2017-01-26 LAB — CBC WITH DIFFERENTIAL/PLATELET
BASO%: 0.3 % (ref 0.0–2.0)
BASOS ABS: 0.1 10*3/uL (ref 0.0–0.1)
EOS%: 1.6 % (ref 0.0–7.0)
Eosinophils Absolute: 0.3 10*3/uL (ref 0.0–0.5)
HCT: 43 % (ref 38.4–49.9)
HGB: 13.7 g/dL (ref 13.0–17.1)
LYMPH%: 76.5 % — AB (ref 14.0–49.0)
MCH: 28.9 pg (ref 27.2–33.4)
MCHC: 31.9 g/dL — AB (ref 32.0–36.0)
MCV: 90.7 fL (ref 79.3–98.0)
MONO#: 0.7 10*3/uL (ref 0.1–0.9)
MONO%: 3.8 % (ref 0.0–14.0)
NEUT#: 3.4 10*3/uL (ref 1.5–6.5)
NEUT%: 17.8 % — AB (ref 39.0–75.0)
Platelets: 127 10*3/uL — ABNORMAL LOW (ref 140–400)
RBC: 4.74 10*6/uL (ref 4.20–5.82)
RDW: 14 % (ref 11.0–14.6)
WBC: 19.1 10*3/uL — ABNORMAL HIGH (ref 4.0–10.3)
lymph#: 14.6 10*3/uL — ABNORMAL HIGH (ref 0.9–3.3)

## 2017-01-26 LAB — COMPREHENSIVE METABOLIC PANEL
ALK PHOS: 82 U/L (ref 40–150)
ALT: 23 U/L (ref 0–55)
AST: 23 U/L (ref 5–34)
Albumin: 3.8 g/dL (ref 3.5–5.0)
Anion Gap: 9 mEq/L (ref 3–11)
BUN: 23.5 mg/dL (ref 7.0–26.0)
CHLORIDE: 108 meq/L (ref 98–109)
CO2: 24 mEq/L (ref 22–29)
Calcium: 9.3 mg/dL (ref 8.4–10.4)
Creatinine: 1.2 mg/dL (ref 0.7–1.3)
EGFR: 60 mL/min/{1.73_m2} — AB (ref >90)
GLUCOSE: 101 mg/dL (ref 70–140)
POTASSIUM: 4.5 meq/L (ref 3.5–5.1)
SODIUM: 141 meq/L (ref 136–145)
Total Bilirubin: 0.8 mg/dL (ref 0.20–1.20)
Total Protein: 7 g/dL (ref 6.4–8.3)

## 2017-01-26 LAB — TECHNOLOGIST REVIEW

## 2017-01-26 NOTE — Progress Notes (Signed)
Hematology and Oncology Follow Up Visit  Austin Herman [id number] 06-19-1940 76 y.o. 01/26/2017 9:28 AM [name], MDPaterson, [name], MD   Principle Diagnosis: 76 year old gentleman with a lymphocytosis diagnosed in November of 2014. This is due to lymphoproliferative disorder representing a CLL.    Prior Therapy: He is status post flow cytometry and CT scan which did not show any diffuse lymphadenopathy but slightly enlarged spleen and suggestion of a lymphoproliferative disorder.  Current therapy: Observation and surveillance.  Interim History:  Mr. Austin Herman presents today for a followup visit by himself. Since his last visit, he reports no complaints or changes. He remained reasonably active and performs activities of daily living. His appetite remain excellent and have gained weight. He reports no fevers or chills or sweats. Does not report any constitutional symptoms or any lymphadenopathy. He has not reported any recurrent infections. He denied any worsening back pain or pathological fractures. He is planning to move to [place] for family reasons.   Does not report any headaches, blurry vision, syncope or seizures. He is not reporting any abdominal fullness or early satiety. There is no pain nausea or vomiting or change in his bowel habits. He did not report any chest pain or palpitation. Does not report any  shortness of breath or wheezing. He does not report any cough or hemoptysis. He does not report any frequency urgency or urinary symptoms. He does not have any petechiae or rashes. Does not report any bleeding or clotting tendencies. Rest of his review of systems unremarkable  Medications: I have reviewed the patient's current medications.  Current Outpatient Prescriptions  Medication Sig Dispense Refill  . aspirin EC 81 MG tablet Take 81 mg by mouth daily.    . CYANOCOBALAMIN PO Take 1,200 mg by mouth daily.    . Dapagliflozin Propanediol (FARXIGA) 10 MG TABS Take 10 mg by  mouth daily.    . fluticasone (FLONASE) 50 MCG/ACT nasal spray Place 1 spray into the nose daily as needed for allergies.     Marland Kitchen gabapentin (NEURONTIN) 300 MG capsule Take 1 capsule by mouth at bedtime.  3  . HYDROcodone-acetaminophen (NORCO/VICODIN) 5-325 MG per tablet Take 1 tablet by mouth daily as needed for moderate pain.     . metFORMIN (GLUCOPHAGE-XR) 500 MG 24 hr tablet Take 500 mg by mouth 2 (two) times daily.    . metoprolol succinate (TOPROL-XL) 25 MG 24 hr tablet TAKE 1 TABLET BY MOUTH EVERY DAY 90 tablet 1  . Multiple Vitamin (MULTIVITAMIN WITH MINERALS) TABS tablet Take 1 tablet by mouth daily.    Marland Kitchen NITROSTAT 0.4 MG SL tablet Place 0.4 mg under the tongue every 5 (five) minutes as needed for chest pain.     . Omega-3 Fatty Acids (FISH OIL) 1200 MG CAPS Take 1,200 mg by mouth daily.    . simvastatin (ZOCOR) 40 MG tablet Take 40 mg by mouth at bedtime.      . tamsulosin (FLOMAX) 0.4 MG CAPS capsule Take 0.4 mg by mouth daily after supper.    . vitamin C (ASCORBIC ACID) 500 MG tablet Take 500 mg by mouth 2 (two) times daily.     No current facility-administered medications for this visit.      Allergies:  Allergies  Allergen Reactions  . Codeine Rash  . Lipitor [Atorvastatin] Itching and Rash    Past Medical History, Surgical history, Social history, and Family History were reviewed and updated.   Physical Exam: Blood pressure (!) 116/59, pulse (!) 58, temperature  3 F (36.7 C), temperature source Oral, resp. rate 17, height 5' 10.5" (1.791 m), weight 210 lb 8 oz (95.5 kg), SpO2 97 %.   ECOG:  0 General appearance: Well-appearing gentleman without distress. Head: Normocephalic, without obvious abnormality and oral ulcers or lesions. Neck: no adenopathy Lymph nodes: No lymphadenopathy noted in the cervical, inguinal or supraclavicular area. Heart:regular rate and rhythm, S1, S2 normal, no murmur, click, rub or gallop Lung:chest clear, no wheezing, rales, normal  symmetric air entry Abdomin: soft, non-tender, without masses or organomegaly no shifting dullness or ascites. EXT:no erythema, induration, or nodules   Lab Results: Lab Results  Component Value Date   WBC 19.1 (H) 01/26/2017   HGB 13.7 01/26/2017   HCT 43.0 01/26/2017   MCV 90.7 01/26/2017   PLT 127 (L) 01/26/2017     Chemistry      Component Value Date/Time   NA 139 07/14/2016 0831   K 4.5 07/14/2016 0831   CL 107 01/02/2014 0802   CO2 21 (L) 07/14/2016 0831   BUN 24.2 07/14/2016 0831   CREATININE 1.2 07/14/2016 0831      Component Value Date/Time   CALCIUM 9.5 07/14/2016 0831   ALKPHOS 93 07/14/2016 0831   AST 22 07/14/2016 0831   ALT 40 07/14/2016 0831   BILITOT 0.77 07/14/2016 0831        Impression and Plan:  76 year old gentleman with the following issues:  1. 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 The differential diagnosis again includes CLL versus mantle cell lymphoma. His CT scan in 2014 showed slightly enlarged spleen.   His laboratory data were reviewed again today as well as his clinical physical examination. No changes noted at this time with his lymphocytosis has decreased in the last 6 months.  The plan is to continue with observation and surveillance every 6 months sooner if needed to. He is planning to move to [place] in the immediate future and urgent to establish care with hematology/oncology services once he moved. He is to contact us with the name of his physicians there and we'll forward all medical records appropriately to them.    2. 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 Continues to be asymptomatic.  3. Thrombocytopenia: Very mild and unchanged from previous counts. He has no signs and symptoms of bleeding. No intervention is needed at this time.  4. Followup will be in 6 months time if he doesn't move to [place].  Zola Button, MD 9/12/20189:28 AM

## 2017-01-26 NOTE — Telephone Encounter (Signed)
No 9/12 los.  

## 2017-01-28 ENCOUNTER — Ambulatory Visit (INDEPENDENT_AMBULATORY_CARE_PROVIDER_SITE_OTHER): Payer: Medicare Other | Admitting: Cardiovascular Disease

## 2017-01-28 ENCOUNTER — Encounter: Payer: Self-pay | Admitting: Cardiovascular Disease

## 2017-01-28 VITALS — BP 148/64 | HR 62 | Ht 71.0 in | Wt 211.0 lb

## 2017-01-28 DIAGNOSIS — I739 Peripheral vascular disease, unspecified: Secondary | ICD-10-CM

## 2017-01-28 DIAGNOSIS — I447 Left bundle-branch block, unspecified: Secondary | ICD-10-CM | POA: Diagnosis not present

## 2017-01-28 DIAGNOSIS — I2510 Atherosclerotic heart disease of native coronary artery without angina pectoris: Secondary | ICD-10-CM

## 2017-01-28 DIAGNOSIS — I779 Disorder of arteries and arterioles, unspecified: Secondary | ICD-10-CM

## 2017-01-28 DIAGNOSIS — I10 Essential (primary) hypertension: Secondary | ICD-10-CM

## 2017-01-28 DIAGNOSIS — E7800 Pure hypercholesterolemia, unspecified: Secondary | ICD-10-CM

## 2017-01-28 MED ORDER — NITROGLYCERIN 0.4 MG SL SUBL: 0.4000 mg | SUBLINGUAL_TABLET | SUBLINGUAL | 99 refills | Status: AC | PRN

## 2017-01-28 NOTE — Assessment & Plan Note (Signed)
Chronic. 

## 2017-01-28 NOTE — Progress Notes (Signed)
01/28/2017 Austin Herman   [birth year]  [id number]  Primary Physician Austin Battles, MD Primary Cardiologist: Austin Harp MD Austin Herman, [place], [place]  HPI:  Austin Herman is a 76 y.o. male mild to moderately overweight, married Caucasian male, father of 55, grandfather to 4 grandchildren whose wife unfortunately is wheelchair bound with multiple sclerosis which has been progressively worsening.I last saw him 04/13/16.The patient is the primary caregiver and therefore is under a lot of stress at home. He has a history of CAD status post coronary artery bypass grafting in 1994. His other problems include hypertension and hyperlipidemia. He denied chest pain or shortness of breath. I cath'd him June 26, 2008, revealing a patent LIMA to his LAD, a patent vein to a diagonal branch and a patent sequential vein to OM1 and 2 with an 80% stenosis just beyond the distal insertion and a 70% mid nondominant RCA stenosis with normal LV function. 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 His last Myoview performed July 24, 2010, was nonischemic. Recent lab work performed by Dr. Philip Herman revealed total cholesterol 193, LDL of 103 and HDL of 32., moderately increased compared to his prior readings.  He was admitted to the hospital on 04/25/13 with unstable angina and underwent cardiac catheterization by Dr. Glenetta Herman revealing high-grade stenosis in the circumflex obtuse marginal branch vein graft as well as in the diagonal branch the graft. He ruled out for myocardial infarction. Dr. Ellyn Herman stented his circumflex vein graft with a bare-metal stent to the left radial approach and the following day I stented the diagonal branch vein graft with a bare-metal stent. He was discharged the following day. We have been following his renal and  carotid Dopplers all of which have remained stable. Since I saw him in a year ago he's remained clinically stable. He apparently is moving down to [place] next week to be closer to his son because his wife apparently is becoming too difficult to manage on his own.   Current Meds  Medication Sig  . aspirin EC 81 MG tablet Take 81 mg by mouth daily.  . CYANOCOBALAMIN PO Take 1,200 mg by mouth daily.  . empagliflozin (JARDIANCE) 25 MG TABS tablet Take 25 mg by mouth daily.  . fluticasone (FLONASE) 50 MCG/ACT nasal spray Place 1 spray into the nose daily as needed for allergies.   Marland Kitchen gabapentin (NEURONTIN) 300 MG capsule Take 1 capsule by mouth at bedtime.  Marland Kitchen HYDROcodone-acetaminophen (NORCO/VICODIN) 5-325 MG per tablet Take 1 tablet by mouth daily as needed for moderate pain.   . metFORMIN (GLUCOPHAGE-XR) 500 MG 24 hr tablet Take 500 mg by mouth 2 (two) times daily.  . metoprolol succinate (TOPROL-XL) 25 MG 24 hr tablet TAKE 1 TABLET BY MOUTH EVERY DAY  . Multiple Vitamin (MULTIVITAMIN WITH MINERALS) TABS tablet Take 1 tablet by mouth daily.  . nitroGLYCERIN (NITROSTAT) 0.4 MG SL tablet Place 1 tablet (0.4 mg total) under the tongue every 5 (five) minutes as needed for chest pain.  . Omega-3 Fatty Acids (FISH OIL) 1200 MG CAPS Take 1,200 mg by mouth daily.  . simvastatin (ZOCOR) 40 MG tablet Take 40 mg by mouth at bedtime.    . tamsulosin (FLOMAX) 0.4 MG CAPS capsule Take 0.4 mg by mouth daily after supper.  . vitamin C (ASCORBIC  ACID) 500 MG tablet Take 500 mg by mouth 2 (two) times daily.  . [DISCONTINUED] Dapagliflozin Propanediol (FARXIGA) 10 MG TABS Take 10 mg by mouth daily.  . [DISCONTINUED] NITROSTAT 0.4 MG SL tablet Place 0.4 mg under the tongue every 5 (five) minutes as needed for chest pain.      Allergies  Allergen Reactions  . Codeine Rash  . Lipitor [Atorvastatin] Itching and Rash    Social History   Social History  . Marital status: Married    Spouse name: N/A  .  Number of children: N/A  . Years of education: N/A   Occupational History  . Not on file.   Social History Main Topics  . Smoking status: Former Smoker    Years: 20.00    Types: Cigars  . Smokeless tobacco: Former Systems developer    Types: [place] date: 04/14/2013     Comment: 04/25/2013 "chew occasionally; maybe once/week if that"  . Alcohol use Yes     Comment: 04/25/2013 "haven't drank a beer in months; never had problem w/it"  . Drug use: No  . Sexual activity: No   Other Topics Concern  . Not on file   Social History Narrative  . No narrative on file     Review of Systems: General: negative for chills, fever, night sweats or weight changes.  Cardiovascular: negative for chest pain, dyspnea on exertion, edema, orthopnea, palpitations, paroxysmal nocturnal dyspnea or shortness of breath Dermatological: negative for rash Respiratory: negative for cough or wheezing Urologic: negative for hematuria Abdominal: negative for nausea, vomiting, diarrhea, bright red blood per rectum, melena, or hematemesis Neurologic: negative for visual changes, syncope, or dizziness All other systems reviewed and are otherwise negative except as noted above.    Blood pressure (!) 148/64, pulse 62, height 5\' 11"  (1.803 m), weight 211 lb (95.7 kg).  General appearance: alert and no distress Neck: no adenopathy, no carotid bruit, no JVD, supple, symmetrical, trachea midline and thyroid not enlarged, symmetric, no tenderness/mass/nodules Lungs: clear to auscultation bilaterally Abdomen: soft, non-tender; bowel sounds normal; no masses,  no organomegaly Extremities: extremities normal, atraumatic, no cyanosis or edema Pulses: 2+ and symmetric Skin: Skin color, texture, turgor normal. No rashes or lesions Neurologic: Alert and oriented X 3, normal strength and tone. Normal symmetric reflexes. Normal coordination and gait  EKG sinus rhythm at 62 with left bundle-branch block. I personally reviewed this  EKG.  ASSESSMENT AND PLAN:   CAD - CABG in 1994 - s/p PCI + BMS to SVG-OM and SVG-diagonal branch 04/26/13 History of CAD status post bypass grafting in 1994. I cathed him 06/26/2008 revealed a patent LIMA to the LAD, patent vein to diagonal branch and patent sequential vein to OM1 until with a percent stenosis just be the distal insertion of a 70% mid nondominant RCA stenosis with normal LV function. He was catheterized by Dr. Ellyn Herman in the setting of unstable angina 04/25/13. Dr. Ellyn Herman stent of the circumflex vein graft with a bare metal stent to the left radial approach and the following day I stented his diagonal branch vein graft to the bare metal stent as well. He's had a couple episodes of nitrate responsive angina probably because of the recent stress of his imminent move to [place] but otherwise he has remained stable.  PAD (peripheral artery disease) History of peripheral arterial disease with a known 75% left renal artery stenosis demonstrated the time of cardiac catheterization.  HTN (hypertension) History of essential hypertension blood pressure measured  148/64. He is on metoprolol. Continue current meds at current dosing  Hyperlipidemia History of hyperlipidemia on statin therapy. This will be followed by his PCP in the future.  LBBB (left bundle branch block) Chronic  Bilateral carotid artery disease ([place]) History of moderate bilateral ICA stenosis by 2 parts ultrasound 06/15/16. This should be followed on an annual basis.      Austin Harp MD FACP,FACC,FAHA, [facility] 01/28/2017 3:35 PM

## 2017-01-28 NOTE — Assessment & Plan Note (Signed)
History of hyperlipidemia on statin therapy. This will be followed by his PCP in the future.

## 2017-01-28 NOTE — Assessment & Plan Note (Signed)
History of peripheral arterial disease with a known 75% left renal artery stenosis demonstrated the time of cardiac catheterization.

## 2017-01-28 NOTE — Patient Instructions (Signed)
Medication Instructions:  ?Your physician recommends that you continue on your current medications as directed. Please refer to the Current Medication list given to you today.  ? ?Labwork: ?NONE ? ?Testing/Procedures: ?NONE ? ?Follow-Up: ?AS NEEDED  ? ?  ?

## 2017-01-28 NOTE — Assessment & Plan Note (Signed)
History of moderate bilateral ICA stenosis by 2 parts ultrasound 06/15/16. This should be followed on an annual basis.

## 2017-01-28 NOTE — Assessment & Plan Note (Signed)
History of CAD status post bypass grafting in 1994. I cathed him 06/26/2008 revealed a patent LIMA to the LAD, patent vein to diagonal branch and patent sequential vein to OM1 until with a percent stenosis just be the distal insertion of a 70% mid nondominant RCA stenosis with normal LV function. He was catheterized by Dr. Ellyn Hack in the setting of unstable angina 04/25/13. Dr. Ellyn Hack stent of the circumflex vein graft with a bare metal stent to the left radial approach and the following day I stented his diagonal branch vein graft to the bare metal stent as well. He's had a couple episodes of nitrate responsive angina probably because of the recent stress of his imminent move to [place] but otherwise he has remained stable.

## 2017-01-28 NOTE — Assessment & Plan Note (Signed)
History of essential hypertension blood pressure measured 148/64. He is on metoprolol. Continue current meds at current dosing

## 2017-05-27 ENCOUNTER — Telehealth: Payer: Self-pay | Admitting: Oncology

## 2017-05-27 NOTE — Telephone Encounter (Signed)
Faxed records to [facility] oncology [phone]

## 2017-07-27 ENCOUNTER — Telehealth: Payer: Self-pay | Admitting: Cardiovascular Disease

## 2017-07-27 NOTE — Telephone Encounter (Signed)
New Message  Pt states that he needs his doppler results sent to [place]. Please call

## 2017-08-15 ENCOUNTER — Other Ambulatory Visit (HOSPITAL_COMMUNITY): Payer: Self-pay | Admitting: Cardiovascular Disease

## 2017-11-21 ENCOUNTER — Other Ambulatory Visit (HOSPITAL_COMMUNITY): Payer: Self-pay | Admitting: Cardiovascular Disease

## 2022-03-17 DEATH — deceased
